# Patient Record
Sex: Female | Born: 1990 | Race: White | Hispanic: No | Marital: Single | Smoking: Former smoker
Health system: Southern US, Community
[De-identification: ages and names within clinical notes are randomized; demographics above are authoritative.]

## PROBLEM LIST (undated history)

## (undated) DIAGNOSIS — J45909 Unspecified asthma, uncomplicated: Secondary | ICD-10-CM

## (undated) DIAGNOSIS — Z5189 Encounter for other specified aftercare: Secondary | ICD-10-CM

## (undated) DIAGNOSIS — I10 Essential (primary) hypertension: Secondary | ICD-10-CM

## (undated) DIAGNOSIS — R011 Cardiac murmur, unspecified: Secondary | ICD-10-CM

## (undated) DIAGNOSIS — F988 Other specified behavioral and emotional disorders with onset usually occurring in childhood and adolescence: Secondary | ICD-10-CM

## (undated) DIAGNOSIS — E669 Obesity, unspecified: Secondary | ICD-10-CM

## (undated) DIAGNOSIS — T7840XA Allergy, unspecified, initial encounter: Secondary | ICD-10-CM

## (undated) HISTORY — DX: Cardiac murmur, unspecified: R01.1

## (undated) HISTORY — DX: Essential (primary) hypertension: I10

## (undated) HISTORY — DX: Obesity, unspecified: E66.9

## (undated) HISTORY — PX: FOOT SURGERY: SHX648

## (undated) HISTORY — DX: Allergy, unspecified, initial encounter: T78.40XA

## (undated) HISTORY — DX: Encounter for other specified aftercare: Z51.89

## (undated) HISTORY — PX: INTRAUTERINE DEVICE INSERTION: SHX323

---
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
## 2008-03-04 ENCOUNTER — Observation Stay: Payer: Self-pay

## 2008-04-18 ENCOUNTER — Observation Stay: Payer: Self-pay

## 2008-04-22 ENCOUNTER — Observation Stay: Payer: Self-pay | Admitting: Obstetrics and Gynecology

## 2008-04-25 ENCOUNTER — Observation Stay: Payer: Self-pay

## 2008-04-30 ENCOUNTER — Observation Stay: Payer: Self-pay

## 2008-05-05 ENCOUNTER — Observation Stay: Payer: Self-pay

## 2008-05-07 ENCOUNTER — Inpatient Hospital Stay: Payer: Self-pay

## 2008-05-11 ENCOUNTER — Emergency Department: Payer: Self-pay | Admitting: Unknown Physician Specialty

## 2009-06-30 ENCOUNTER — Emergency Department: Payer: Self-pay | Admitting: Internal Medicine

## 2011-03-01 ENCOUNTER — Ambulatory Visit: Payer: Self-pay | Admitting: Internal Medicine

## 2011-03-01 LAB — URINALYSIS, COMPLETE
Bilirubin,UR: NEGATIVE
Glucose,UR: NEGATIVE mg/dL (ref 0–75)
Ketone: NEGATIVE
Nitrite: POSITIVE
Ph: 5 (ref 4.5–8.0)
Specific Gravity: >=1.03 (ref 1.003–1.030)
Squamous Epithelial: >30

## 2011-03-01 LAB — PREGNANCY, URINE: Pregnancy Test, Urine: NEGATIVE m[IU]/mL

## 2012-07-28 DIAGNOSIS — E669 Obesity, unspecified: Secondary | ICD-10-CM | POA: Insufficient documentation

## 2012-07-28 DIAGNOSIS — F17200 Nicotine dependence, unspecified, uncomplicated: Secondary | ICD-10-CM | POA: Insufficient documentation

## 2013-04-02 ENCOUNTER — Emergency Department (HOSPITAL_COMMUNITY): Payer: Medicaid Other

## 2013-04-02 ENCOUNTER — Encounter (HOSPITAL_COMMUNITY): Payer: Self-pay | Admitting: Emergency Medicine

## 2013-04-02 ENCOUNTER — Emergency Department (HOSPITAL_COMMUNITY)
Admission: EM | Admit: 2013-04-02 | Discharge: 2013-04-02 | Disposition: A | Discharge status: Home / Self Care | Payer: Medicaid Other | Attending: Emergency Medicine | Admitting: Emergency Medicine

## 2013-04-02 DIAGNOSIS — B349 Viral infection, unspecified: Secondary | ICD-10-CM

## 2013-04-02 DIAGNOSIS — J45901 Unspecified asthma with (acute) exacerbation: Secondary | ICD-10-CM | POA: Insufficient documentation

## 2013-04-02 DIAGNOSIS — B9789 Other viral agents as the cause of diseases classified elsewhere: Secondary | ICD-10-CM | POA: Insufficient documentation

## 2013-04-02 DIAGNOSIS — Z8659 Personal history of other mental and behavioral disorders: Secondary | ICD-10-CM | POA: Insufficient documentation

## 2013-04-02 DIAGNOSIS — F17200 Nicotine dependence, unspecified, uncomplicated: Secondary | ICD-10-CM | POA: Insufficient documentation

## 2013-04-02 HISTORY — DX: Other specified behavioral and emotional disorders with onset usually occurring in childhood and adolescence: F98.8

## 2013-04-02 HISTORY — DX: Unspecified asthma, uncomplicated: J45.909

## 2013-04-02 LAB — RAPID STREP SCREEN (MED CTR MEBANE ONLY): STREPTOCOCCUS, GROUP A SCREEN (DIRECT): NEGATIVE

## 2013-04-02 MED ORDER — IPRATROPIUM BROMIDE 0.02 % IN SOLN
0.5000 mg | Freq: Once | RESPIRATORY_TRACT | Status: AC
  Administered 2013-04-02: 0.5 mg via RESPIRATORY_TRACT
  Filled 2013-04-02: qty 2.5

## 2013-04-02 MED ORDER — ACETAMINOPHEN 325 MG PO TABS
650.0000 mg | ORAL_TABLET | Freq: Once | ORAL | Status: AC
  Administered 2013-04-02: 650 mg via ORAL
  Filled 2013-04-02: qty 2

## 2013-04-02 MED ORDER — SODIUM CHLORIDE 0.9 % IV SOLN
Freq: Once | INTRAVENOUS | Status: AC
Start: 2013-04-02 — End: 2013-04-02
  Administered 2013-04-02: 14:00:00 via INTRAVENOUS

## 2013-04-02 MED ORDER — GUAIFENESIN-CODEINE 100-10 MG/5ML PO SYRP: 10.0000 mL | ORAL_SOLUTION | Freq: Three times a day (TID) | ORAL | Status: DC | PRN

## 2013-04-02 MED ORDER — OSELTAMIVIR PHOSPHATE 75 MG PO CAPS: 75.0000 mg | ORAL_CAPSULE | Freq: Two times a day (BID) | ORAL | Status: DC

## 2013-04-02 MED ORDER — ALBUTEROL SULFATE HFA 108 (90 BASE) MCG/ACT IN AERS
2.0000 | INHALATION_SPRAY | Freq: Once | RESPIRATORY_TRACT | Status: AC
  Administered 2013-04-02: 2 via RESPIRATORY_TRACT
  Filled 2013-04-02: qty 6.7

## 2013-04-02 MED ORDER — PREDNISONE 10 MG PO TABS
60.0000 mg | ORAL_TABLET | Freq: Once | ORAL | Status: AC
  Administered 2013-04-02: 60 mg via ORAL
  Filled 2013-04-02 (×2): qty 1

## 2013-04-02 MED ORDER — IBUPROFEN 800 MG PO TABS: 800.0000 mg | ORAL_TABLET | Freq: Three times a day (TID) | ORAL | Status: DC

## 2013-04-02 MED ORDER — ALBUTEROL SULFATE (2.5 MG/3ML) 0.083% IN NEBU
5.0000 mg | INHALATION_SOLUTION | Freq: Once | RESPIRATORY_TRACT | Status: AC
Start: 2013-04-02 — End: 2013-04-02
  Administered 2013-04-02: 5 mg via RESPIRATORY_TRACT
  Filled 2013-04-02: qty 6

## 2013-04-02 NOTE — ED Notes (Signed)
Pt given two cups of ice and sprite. Pt tolerating well.

## 2013-04-02 NOTE — ED Notes (Signed)
Patient c/o fever, cough, sore throat, and nausea that started yesterday. Patient reports fever as 102.1. Patient reports taking a flu multi symptom over the counter medication at 10:30pm with no relief. Patient denies any vomiting or diarrhea. Patient also reports some wheezing. Patient has hx of asthma. Patient reports using neb treatment this morning at 12am with some relief in wheezing.

## 2013-04-02 NOTE — Discharge Instructions (Signed)
Viral Infections °A virus is a type of germ. Viruses can cause: °· Minor sore throats. °· Aches and pains. °· Headaches. °· Runny nose. °· Rashes. °· Watery eyes. °· Tiredness. °· Coughs. °· Loss of appetite. °· Feeling sick to your stomach (nausea). °· Throwing up (vomiting). °· Watery poop (diarrhea). °HOME CARE  °· Only take medicines as told by your doctor. °· Drink enough water and fluids to keep your pee (urine) clear or pale yellow. Sports drinks are a good choice. °· Get plenty of rest and eat healthy. Soups and broths with crackers or rice are fine. °GET HELP RIGHT AWAY IF:  °· You have a very bad headache. °· You have shortness of breath. °· You have chest pain or neck pain. °· You have an unusual rash. °· You cannot stop throwing up. °· You have watery poop that does not stop. °· You cannot keep fluids down. °· You or your child has a temperature by mouth above 102° F (38.9° C), not controlled by medicine. °· Your baby is older than 3 months with a rectal temperature of 102° F (38.9° C) or higher. °· Your baby is 3 months old or younger with a rectal temperature of 100.4° F (38° C) or higher. °MAKE SURE YOU:  °· Understand these instructions. °· Will watch this condition. °· Will get help right away if you are not doing well or get worse. °Document Released: 01/22/2008 Document Revised: 05/03/2011 Document Reviewed: 06/16/2010 °ExitCare® Patient Information ©2014 [employer]. ° °

## 2013-04-02 NOTE — ED Provider Notes (Signed)
CSN: [id number]     Arrival date & time 04/02/13  0840 History   This chart was scribed for Pauline Aus, PA-C by Ladona Ridgel Day, ED scribe. This patient was seen in room APA15/AP[facility]15 and the patient's care was started at 0840.  Chief Complaint  Patient presents with  . Fever  . Cough   The history is provided by the patient. No language interpreter was used.   HPI Comments: Amanda [name] is a 23 y.o. female who presents to the Emergency Department complaining of constant, gradually worsened fever/chills (max T 102.1 F), myalgias, non-productive, burning cough, chest congestion, wheezing (hx of asthma), onset yesterday afternoon. She reports hx of asthma and last used her maintenance inhaler last PM. She tried taking multi sx flu medicine w/out any relief from fever.  Significant other and her child have had similar sx's recently.  She denies vomiting, diarrhea, dysuria, dyspnea, abdominal or chest pain No allergies to medicines  Past Medical History  Diagnosis Date  . Asthma   . ADD (attention deficit disorder)    History reviewed. No pertinent past surgical history. No family history on file. History  Substance Use Topics  . Smoking status: Current Every Day Smoker -- 0.04 packs/day for 8 years    Types: Cigarettes  . Smokeless tobacco: Never Used  . Alcohol Use: No   OB History   Grav Para Term Preterm Abortions TAB SAB Ect Mult Living   1 1 1       1      Review of Systems  Constitutional: Positive for fever and chills. Negative for fatigue.  HENT: Positive for congestion and sore throat. Negative for trouble swallowing.   Respiratory: Positive for cough and wheezing. Negative for chest tightness and shortness of breath.   Cardiovascular: Negative for chest pain and palpitations.  Gastrointestinal: Positive for nausea. Negative for vomiting, abdominal pain, diarrhea and blood in stool.  Genitourinary: Negative for dysuria, hematuria and flank pain.  Musculoskeletal: Positive  for myalgias. Negative for arthralgias, back pain, neck pain and neck stiffness.  Skin: Negative for rash.  Neurological: Negative for dizziness, weakness and numbness.  Hematological: Does not bruise/bleed easily.   Allergies  Review of patient's allergies indicates no known allergies.  Home Medications   Current Outpatient Rx  Name  Route  Sig  Dispense  Refill  . OVER THE COUNTER MEDICATION   Oral   Take 2 capsules by mouth daily as needed (for cold/flu symptoms). OTC multi symptom cold and flu medication          Triage Vitals: BP 101/54  Pulse 127  Temp(Src) 99.1 F (37.3 C) (Oral)  Resp 20  Ht 5\' 5"  (1.651 m)  Wt 244 lb 1.6 oz (110.723 kg)  BMI 40.62 kg/m2  SpO2 95%  Physical Exam  Constitutional: She is oriented to person, place, and time. She appears well-developed and well-nourished. No distress.  HENT:  Head: Normocephalic and atraumatic.  Right Ear: Tympanic membrane and ear canal normal.  Left Ear: Ear canal normal. Tympanic membrane is not perforated, not retracted and not bulging. No hemotympanum.  Nose: Mucosal edema present. No rhinorrhea.  Mouth/Throat: Uvula is midline and mucous membranes are normal. No trismus in the jaw. No uvula swelling. Posterior oropharyngeal erythema present. No oropharyngeal exudate, posterior oropharyngeal edema or tonsillar abscesses.  Left ear mild effusion behind TM, no bulging, no erythema.   Neck: Normal range of motion and full passive range of motion without pain. Neck supple. No spinous process  tenderness and no muscular tenderness present. Normal range of motion present. No Brudzinski's sign and no Kernig's sign noted.  No meningeal signs    Cardiovascular: Normal rate, regular rhythm, normal heart sounds and intact distal pulses.   No murmur heard. Pulmonary/Chest: Effort normal. She has wheezes (few scattered epxiratory wheezes). She has no rales.  Abdominal: Soft. She exhibits no distension and no mass. There is no  tenderness. There is no rebound and no guarding.  Musculoskeletal: Normal range of motion.  Lymphadenopathy:    She has no cervical adenopathy.  Neurological: She is alert and oriented to person, place, and time. She exhibits normal muscle tone. Coordination normal.  Skin: Skin is warm and dry. No rash noted.   ED Course  Procedures (including critical care time) DIAGNOSTIC STUDIES: Oxygen Saturation is 95% on room air, adequate by my interpretation.    COORDINATION OF CARE: At 1005 AM Discussed treatment plan with patient which includes CXR, A&A neb, tylenol and oral fluids. Patient agrees.   Labs Review Labs Reviewed  RAPID STREP SCREEN  CULTURE, GROUP A STREP   Imaging Review Dg Chest 2 View  04/02/2013   CLINICAL DATA:  Fever, cough, sore throat  EXAM: CHEST  2 VIEW  COMPARISON:  None.  FINDINGS: Cardiomediastinal silhouette is unremarkable. Mild thoracic dextroscoliosis. No acute infiltrate or pleural effusion. No pulmonary edema.  IMPRESSION: No active cardiopulmonary disease.  Mild thoracic dextroscoliosis.   Electronically Signed   By: Natasha MeadLiviu  Pop M.D.   On: 04/02/2013 10:43    EKG Interpretation   None      MDM   Vital signs improved after IVF's, tylenol.  Wheezes resolved after albuterol neb.  Patient is feeling much better and requesting discharge.  Pt requested MDI for home use, inhaler dispensed.  Sick contacts at home with similar sx's, clinical suspicion for PE is low.  Likely influenza.  Will treat with Tamiflu, robitussin AC, and ibuprofen.  Pt agrees to chare plan and appears stable for discharge.  Strict return precautions also given   1. Viral illness     I personally performed the services described in this documentation, which was scribed in my presence. The recorded information has been reviewed and is accurate.        Dezi Brauner L. Trisha Mangleriplett, PA-C 04/03/13 1615

## 2013-04-04 LAB — CULTURE, GROUP A STREP

## 2013-04-04 NOTE — ED Provider Notes (Signed)
Medical screening examination/treatment/procedure(s) were performed by non-physician practitioner and as supervising physician I was immediately available for consultation/collaboration.  EKG Interpretation   None         Mayce Noyes M Anajah Sterbenz, DO 04/04/13 0804 

## 2013-12-24 ENCOUNTER — Encounter (HOSPITAL_COMMUNITY): Payer: Self-pay | Admitting: Emergency Medicine

## 2013-12-31 DIAGNOSIS — I071 Rheumatic tricuspid insufficiency: Secondary | ICD-10-CM | POA: Insufficient documentation

## 2013-12-31 DIAGNOSIS — R5383 Other fatigue: Secondary | ICD-10-CM | POA: Insufficient documentation

## 2016-03-18 DIAGNOSIS — K219 Gastro-esophageal reflux disease without esophagitis: Secondary | ICD-10-CM | POA: Insufficient documentation

## 2016-04-28 DIAGNOSIS — E559 Vitamin D deficiency, unspecified: Secondary | ICD-10-CM | POA: Insufficient documentation

## 2016-05-12 ENCOUNTER — Ambulatory Visit: Payer: Self-pay | Admitting: Podiatry

## 2016-05-28 ENCOUNTER — Ambulatory Visit: Payer: Self-pay | Admitting: Podiatry

## 2016-06-15 ENCOUNTER — Ambulatory Visit: Payer: Self-pay | Admitting: Podiatry

## 2016-06-30 ENCOUNTER — Ambulatory Visit (INDEPENDENT_AMBULATORY_CARE_PROVIDER_SITE_OTHER): Payer: Medicaid Other | Admitting: Podiatry

## 2016-06-30 DIAGNOSIS — L600 Ingrowing nail: Secondary | ICD-10-CM

## 2016-06-30 MED ORDER — NEOMYCIN-POLYMYXIN-HC 1 % OT SOLN: OTIC | 1 refills | Status: DC

## 2016-06-30 NOTE — Progress Notes (Signed)
She presents today with a chief complaint of a painful hallux nail right. She states than bothersome for many years she had trauma to his several years ago and like to just have it removed. She states is becoming more more painful as time goes on she cannot tolerate it any longer.  Objective: Vital signs are stable alert oriented 3 pulses are palpable. Neurological systems intact. Deep tendon reflexes are intact. Muscle strength is 5 over 5 dorsi for spinal flexion inverters everters all his musculature is intact. Orthopedic evaluation was resolved assistant to the ankle full range of motion without crepitation. Cutaneous evaluationof well-hydrated Q is no erythema or edema cellulitis drainage or odor. Sharp radial nail margin along the tibial border of the hallux right with distal onycholysis and nail dystrophy.  Assessment: Ingrown nail for his hallux right.  Plan: Total matrixectomy today hallux right. She tolerated this procedure well after local anesthesia was administered she was provided with both oral and written home going instructions as well as prescription for Cortisporin Otic to be applied twice daily after soaking. I will follow up with her in 1-2 weeks.

## 2016-06-30 NOTE — Patient Instructions (Signed)

## 2016-07-26 ENCOUNTER — Ambulatory Visit: Payer: Medicaid Other | Admitting: Podiatry

## 2016-08-02 ENCOUNTER — Ambulatory Visit (INDEPENDENT_AMBULATORY_CARE_PROVIDER_SITE_OTHER): Payer: Self-pay | Admitting: Podiatry

## 2016-08-02 ENCOUNTER — Encounter: Payer: Self-pay | Admitting: Podiatry

## 2016-08-02 DIAGNOSIS — L600 Ingrowing nail: Secondary | ICD-10-CM

## 2016-08-02 NOTE — Patient Instructions (Signed)

## 2016-08-02 NOTE — Progress Notes (Signed)
She presents today for follow-up of her matrixectomy hallux right. States that it seems to be a little sore but other than that is doing really well.  Objective: No erythema cellulitis drainage or odor appears to be healing quite nicely one small area centrally located of the nailbed that still has yet to heal but otherwise there is no signs of infection and healing appears to be good.  Assessment: Healing surgical toe hallux right.  Plan: Soak once a day or once every other day in Epsom salts and warm water covered in the daily definite bedtime. What for signs and symptoms of infection and notify us if there are any.

## 2016-08-04 ENCOUNTER — Ambulatory Visit: Payer: Medicaid Other | Admitting: Podiatry

## 2017-01-10 ENCOUNTER — Ambulatory Visit: Payer: Self-pay | Admitting: Physician Assistant

## 2017-01-28 ENCOUNTER — Ambulatory Visit: Payer: Self-pay | Admitting: Physician Assistant

## 2017-03-02 ENCOUNTER — Encounter: Payer: Self-pay | Admitting: Physician Assistant

## 2017-03-02 ENCOUNTER — Other Ambulatory Visit: Payer: Self-pay

## 2017-03-02 ENCOUNTER — Ambulatory Visit: Payer: Medicaid Other | Admitting: Physician Assistant

## 2017-03-02 VITALS — BP 110/60 | HR 75 | Temp 98.5°F | Resp 16 | Ht 65.0 in | Wt 303.0 lb

## 2017-03-02 DIAGNOSIS — Z7689 Persons encountering health services in other specified circumstances: Secondary | ICD-10-CM | POA: Diagnosis not present

## 2017-03-02 DIAGNOSIS — K219 Gastro-esophageal reflux disease without esophagitis: Secondary | ICD-10-CM

## 2017-03-02 DIAGNOSIS — I071 Rheumatic tricuspid insufficiency: Secondary | ICD-10-CM | POA: Diagnosis not present

## 2017-03-02 DIAGNOSIS — Z6841 Body Mass Index (BMI) 40.0 and over, adult: Secondary | ICD-10-CM | POA: Diagnosis not present

## 2017-03-02 DIAGNOSIS — J301 Allergic rhinitis due to pollen: Secondary | ICD-10-CM | POA: Diagnosis not present

## 2017-03-02 MED ORDER — FLUTICASONE FUROATE 27.5 MCG/SPRAY NA SUSP
2.0000 | Freq: Every day | NASAL | 12 refills | Status: DC
Start: 2017-03-02 — End: 2017-06-03

## 2017-03-02 MED ORDER — CETIRIZINE HCL 10 MG PO TABS: 10.0000 mg | ORAL_TABLET | Freq: Every day | ORAL | 11 refills | Status: DC

## 2017-03-02 MED ORDER — MONTELUKAST SODIUM 10 MG PO TABS: 10.0000 mg | ORAL_TABLET | Freq: Every day | ORAL | 3 refills | Status: DC

## 2017-03-02 MED ORDER — OMEPRAZOLE 40 MG PO CPDR: DELAYED_RELEASE_CAPSULE | ORAL | 1 refills | Status: DC

## 2017-03-02 NOTE — Progress Notes (Signed)
Name: Amanda [name]   MRN: [id number]    DOB: [birth year]   Date:03/02/2017       Progress Note  Subjective  Chief Complaint  Chief Complaint  Patient presents with  . New Patient (Initial Visit)    HPI Patient is here today as a new patient to establish care.She also wants to talk about weight loss. Patient reports that she has taken Phentermine before. Last time she remembers was back in May 2018. She reports that she already got a gym membership and started to go today. She also reports that she started to do the Keto diet but it was hard to follow so she switch to Weight Watchers.   Past Medical History:  Diagnosis Date  . ADD (attention deficit disorder)   . Allergy   . Asthma   . Blood transfusion without reported diagnosis   . Heart murmur     History reviewed. No pertinent surgical history.  Family History  Problem Relation Age of Onset  . Cancer Paternal Aunt        Breast Cancer    Social History   Socioeconomic History  . Marital status: Single    Spouse name: Not on file  . Number of children: Not on file  . Years of education: Not on file  . Highest education level: Not on file  Social Needs  . Financial resource strain: Not on file  . Food insecurity - worry: Not on file  . Food insecurity - inability: Not on file  . Transportation needs - medical: Not on file  . Transportation needs - non-medical: Not on file  Occupational History  . Not on file  Tobacco Use  . Smoking status: Former Smoker    Packs/day: 0.04    Years: 8.00    Pack years: 0.32    Types: Cigarettes  . Smokeless tobacco: Former NeurosurgeonUser    Quit date: 08/06/2016  Substance and Sexual Activity  . Alcohol use: No  . Drug use: No  . Sexual activity: Yes    Birth control/protection: Implant  Other Topics Concern  . Not on file  Social History Narrative  . Not on file     Current Outpatient Medications:  .  omeprazole (PRILOSEC) 40 MG capsule, take 1 capsule by mouth every morning 1  HOUR PRIOR TO BREAKFAST, Disp: , Rfl: 0 .  phentermine 37.5 MG capsule, Take 37.5 mg by mouth., Disp: , Rfl:  .  NEOMYCIN-POLYMYXIN-HYDROCORTISONE (CORTISPORIN) 1 % SOLN otic solution, Apply 1-2 drops to toe BID after soaking (Patient not taking: Reported on 03/02/2017), Disp: 10 mL, Rfl: 1  No Known Allergies   Review of Systems  Constitutional: Negative.   HENT: Negative.        Sneezing  Eyes: Negative.   Respiratory: Positive for shortness of breath.   Cardiovascular: Negative.   Gastrointestinal: Negative.   Genitourinary: Negative.   Musculoskeletal: Negative.   Skin: Negative.   Neurological: Negative.   Endo/Heme/Allergies: Negative.   Psychiatric/Behavioral: Negative.    Objective  Vitals:   03/02/17 1436  BP: 110/60  Pulse: 75  Resp: 16  Temp: 98.5 F (36.9 C)  TempSrc: Oral  Weight: (!) 303 lb (137.4 kg)  Height: 5\' 5"  (1.651 m)    Physical Exam  Constitutional: She is oriented to person, place, and time and well-developed, well-nourished, and in no distress. No distress.  HENT:  Head: Normocephalic and atraumatic.  Right Ear: Hearing, tympanic membrane, external ear and ear canal normal.  Left Ear: Hearing, tympanic membrane, external ear and ear canal normal.  Nose: Nose normal.  Mouth/Throat: Oropharynx is clear and moist. No oropharyngeal exudate.  Eyes: Conjunctivae and EOM are normal. Pupils are equal, round, and reactive to light. Right eye exhibits no discharge. Left eye exhibits no discharge.  Neck: Normal range of motion. Neck supple. No tracheal deviation present. No thyromegaly present.  Cardiovascular: Normal rate, regular rhythm, normal heart sounds and intact distal pulses.  No murmur heard. Pulmonary/Chest: Effort normal and breath sounds normal. No respiratory distress.  Abdominal: Soft. Bowel sounds are normal. She exhibits no distension. There is no tenderness.  Musculoskeletal: Normal range of motion. She exhibits no edema.   Lymphadenopathy:    She has no cervical adenopathy.  Neurological: She is alert and oriented to person, place, and time.  Skin: Skin is warm and dry. She is not diaphoretic.  Psychiatric: Mood, memory, affect and judgment normal.  Vitals reviewed.   Assessment & Plan  1. Establishing care with new doctor, encounter for  2. Non-seasonal allergic rhinitis due to pollen Will refill for patient to have zyrtec, singulair and flonase on hand for allergic rhinitis.  - cetirizine (ZYRTEC) 10 MG tablet; Take 1 tablet (10 mg total) by mouth daily.  Dispense: 30 tablet; Refill: 11 - montelukast (SINGULAIR) 10 MG tablet; Take 1 tablet (10 mg total) by mouth at bedtime.  Dispense: 30 tablet; Refill: 3 - fluticasone (FLONASE SENSIMIST) 27.5 MCG/SPRAY nasal spray; Place 2 sprays into the nose daily.  Dispense: 10 g; Refill: 12  3. Gastroesophageal reflux disease, esophagitis presence not specified Patient already on medication and stable.  - omeprazole (PRILOSEC) 40 MG capsule; take 1 capsule by mouth every morning 1 HOUR PRIOR TO BREAKFAST  Dispense: 90 capsule; Refill: 1  4. Tricuspid valve insufficiency, unspecified etiology Patient is required to have echo every 2 years for tricuspid regurgitation and left ventricular "stiffening" per patient. Previously done with [facility]. Will obtain medical release form today and request records from [facility] to see when patient will be due for next echo as well as her CPE.  5. Morbidly obese (HCC) Long discussion over weight loss. Advised patient to try a food diary and limit calories to 1200-1400 calories. Lose It may be a good platform for her as it now allows you to take a picture of your food to input caloric intake. She agrees to try. May also continue weight watchers if this is easier. Start slowly adding exercise to make routine. I will see her back in 4 weeks to see how she is doing on her own and will then consider adding phentermine. Patient in  agreement.   6. BMI 50.0-59.9, adult [facility]) See above medical treatment plan.  I spent approximately 45 minutes with the patient today. Over 50% of this time was spent with counseling and educating the patient about weight loss.

## 2017-03-02 NOTE — Patient Instructions (Signed)

## 2017-03-30 ENCOUNTER — Encounter: Payer: Self-pay | Admitting: Physician Assistant

## 2017-03-30 ENCOUNTER — Ambulatory Visit (INDEPENDENT_AMBULATORY_CARE_PROVIDER_SITE_OTHER): Payer: Medicaid Other | Admitting: Physician Assistant

## 2017-03-30 VITALS — BP 110/60 | HR 72 | Temp 97.8°F | Resp 16 | Ht 65.0 in | Wt 304.0 lb

## 2017-03-30 DIAGNOSIS — Z713 Dietary counseling and surveillance: Secondary | ICD-10-CM | POA: Diagnosis not present

## 2017-03-30 DIAGNOSIS — Z6841 Body Mass Index (BMI) 40.0 and over, adult: Secondary | ICD-10-CM | POA: Diagnosis not present

## 2017-03-30 MED ORDER — PHENTERMINE HCL 37.5 MG PO TABS: 37.5000 mg | ORAL_TABLET | Freq: Every day | ORAL | 0 refills | Status: DC

## 2017-03-30 NOTE — Patient Instructions (Signed)

## 2017-03-30 NOTE — Progress Notes (Signed)
Patient: Amanda [name] Female    DOB: 1990-06-17   27 y.o.   MRN: [id number] Visit Date: 03/30/2017  Today's Provider: Margaretann Loveless, PA-C   Chief Complaint  Patient presents with  . Follow-up   Subjective:    HPI  Follow up for weight  The patient was last seen for this 4 weeks ago. Changes made at last visit include patient advised to start diet and exercise.  Patient reports going to the gym 3 days a week 20 minutes. Patient reports doing cardio and some toning. Patient reports some diet changes not eating late at night, and picking healthier snacks.  Wt Readings from Last 3 Encounters:  03/30/17 (!) 304 lb (137.9 kg)  03/02/17 (!) 303 lb (137.4 kg)  04/02/13 244 lb 1.6 oz (110.7 kg)   ------------------------------------------------------------------------------------    No Known Allergies   Current Outpatient Medications:  .  cetirizine (ZYRTEC) 10 MG tablet, Take 1 tablet (10 mg total) by mouth daily., Disp: 30 tablet, Rfl: 11 .  fluticasone (FLONASE SENSIMIST) 27.5 MCG/SPRAY nasal spray, Place 2 sprays into the nose daily., Disp: 10 g, Rfl: 12 .  montelukast (SINGULAIR) 10 MG tablet, Take 1 tablet (10 mg total) by mouth at bedtime., Disp: 30 tablet, Rfl: 3 .  omeprazole (PRILOSEC) 40 MG capsule, take 1 capsule by mouth every morning 1 HOUR PRIOR TO BREAKFAST, Disp: 90 capsule, Rfl: 1  Review of Systems  Constitutional: Negative.   HENT: Negative.   Eyes: Negative.   Respiratory: Negative.   Cardiovascular: Negative.   Musculoskeletal: Negative.     Social History   Tobacco Use  . Smoking status: Former Smoker    Packs/day: 0.04    Years: 8.00    Pack years: 0.32    Types: Cigarettes  . Smokeless tobacco: Former Neurosurgeon    Quit date: 08/06/2016  Substance Use Topics  . Alcohol use: No   Objective:   BP 110/60 (BP Location: Left Arm, Patient Position: Sitting, Cuff Size: Large)   Pulse 72   Temp 97.8 F (36.6 C) (Oral)   Resp 16   Ht 5'  5" (1.651 m)   Wt (!) 304 lb (137.9 kg)   SpO2 98%   BMI 50.59 kg/m  Vitals:   03/30/17 1515  BP: 110/60  Pulse: 72  Resp: 16  Temp: 97.8 F (36.6 C)  TempSrc: Oral  SpO2: 98%  Weight: (!) 304 lb (137.9 kg)  Height: 5\' 5"  (1.651 m)     Physical Exam  Constitutional: She appears well-developed and well-nourished. No distress.  Neck: Normal range of motion. Neck supple. No JVD present. No tracheal deviation present. No thyromegaly present.  Cardiovascular: Normal rate, regular rhythm and normal heart sounds. Exam reveals no gallop and no friction rub.  No murmur heard. Pulmonary/Chest: Effort normal and breath sounds normal. No respiratory distress. She has no wheezes. She has no rales.  Lymphadenopathy:    She has no cervical adenopathy.  Skin: She is not diaphoretic.  Vitals reviewed.      Assessment & Plan:     1. Encounter for weight loss counseling Will start phentermine as below since patient has been making lifestyle changes on her own. I will see her back in 4 weeks to recheck weight. - phentermine (ADIPEX-P) 37.5 MG tablet; Take 1 tablet (37.5 mg total) by mouth daily before breakfast.  Dispense: 30 tablet; Refill: 0  2. Morbidly obese (HCC) See above medical treatment plan. - phentermine (  ADIPEX-P) 37.5 MG tablet; Take 1 tablet (37.5 mg total) by mouth daily before breakfast.  Dispense: 30 tablet; Refill: 0  3. BMI 50.0-59.9, adult [facility](HCC) See above medical treatment plan. - phentermine (ADIPEX-P) 37.5 MG tablet; Take 1 tablet (37.5 mg total) by mouth daily before breakfast.  Dispense: 30 tablet; Refill: 0       Margaretann LovelessJennifer M Taja Pentland, PA-C  [facility]

## 2017-04-28 ENCOUNTER — Encounter: Payer: Self-pay | Admitting: Physician Assistant

## 2017-04-28 ENCOUNTER — Ambulatory Visit (INDEPENDENT_AMBULATORY_CARE_PROVIDER_SITE_OTHER): Payer: Medicaid Other | Admitting: Physician Assistant

## 2017-04-28 VITALS — BP 108/60 | HR 111 | Temp 97.6°F | Resp 16 | Wt 292.0 lb

## 2017-04-28 DIAGNOSIS — Z713 Dietary counseling and surveillance: Secondary | ICD-10-CM

## 2017-04-28 DIAGNOSIS — Z6841 Body Mass Index (BMI) 40.0 and over, adult: Secondary | ICD-10-CM | POA: Diagnosis not present

## 2017-04-28 MED ORDER — PHENTERMINE HCL 37.5 MG PO TABS: 37.5000 mg | ORAL_TABLET | Freq: Every day | ORAL | 2 refills | Status: DC

## 2017-04-28 NOTE — Progress Notes (Signed)
Patient: Amanda [name] Female    DOB: 13-Jul-1990   26 y.o.   MRN: [id number] Visit Date: 04/28/2017  Today's Provider: Margaretann Loveless, PA-C   Chief Complaint  Patient presents with  . Follow-up    Obesity   Subjective:    HPI  Obesity, Follow up:  The patient was last seen for weight loss counseling 1 weeks ago. Changes made since that visit include start Phentermine.  She reports excellent compliance with treatment. She is not having side effects. Some Dry mouth. Previous weight was 304 pounds, today she is 292 pounds. She reports decreased portion size without food diary and exercising 3-4 days per week. Missed this past week due to flu.  ------------------------------------------------------------------------    No Known Allergies   Current Outpatient Medications:  .  cetirizine (ZYRTEC) 10 MG tablet, Take 1 tablet (10 mg total) by mouth daily., Disp: 30 tablet, Rfl: 11 .  fluticasone (FLONASE SENSIMIST) 27.5 MCG/SPRAY nasal spray, Place 2 sprays into the nose daily., Disp: 10 g, Rfl: 12 .  omeprazole (PRILOSEC) 40 MG capsule, take 1 capsule by mouth every morning 1 HOUR PRIOR TO BREAKFAST, Disp: 90 capsule, Rfl: 1 .  phentermine (ADIPEX-P) 37.5 MG tablet, Take 1 tablet (37.5 mg total) by mouth daily before breakfast., Disp: 30 tablet, Rfl: 0 .  montelukast (SINGULAIR) 10 MG tablet, Take 1 tablet (10 mg total) by mouth at bedtime. (Patient not taking: Reported on 04/28/2017), Disp: 30 tablet, Rfl: 3  Review of Systems  Constitutional: Negative.   Respiratory: Negative.   Cardiovascular: Negative.   Neurological: Negative.   Psychiatric/Behavioral: Negative.     Social History   Tobacco Use  . Smoking status: Former Smoker    Packs/day: 0.04    Years: 8.00    Pack years: 0.32    Types: Cigarettes  . Smokeless tobacco: Former Neurosurgeon    Quit date: 08/06/2016  Substance Use Topics  . Alcohol use: No   Objective:   BP 108/60 (BP Location: Left Wrist,  Patient Position: Sitting, Cuff Size: Normal)   Pulse (!) 111   Temp 97.6 F (36.4 C) (Oral)   Resp 16   Wt 292 lb (132.5 kg)   BMI 48.59 kg/m    Physical Exam  Constitutional: She appears well-developed and well-nourished. No distress.  Neck: Normal range of motion. Neck supple. No tracheal deviation present. No thyromegaly present.  Cardiovascular: Normal rate, regular rhythm and normal heart sounds. Exam reveals no gallop and no friction rub.  No murmur heard. Pulmonary/Chest: Effort normal and breath sounds normal. No respiratory distress. She has no wheezes. She has no rales.  Lymphadenopathy:    She has no cervical adenopathy.  Skin: She is not diaphoretic.  Psychiatric: She has a normal mood and affect. Her behavior is normal. Judgment and thought content normal.  Vitals reviewed.      Assessment & Plan:     1. Encounter for weight loss counseling Patient has lost 12 pounds per our record since starting phentermine. Tolerating well. Will continue phentermine as below. Discussed importance of focusing on lifestyle modifications to help her keep weight off in long term.  I will see her back in 3 months for her CPE and weight recheck.  - phentermine (ADIPEX-P) 37.5 MG tablet; Take 1 tablet (37.5 mg total) by mouth daily before breakfast.  Dispense: 30 tablet; Refill: 2  2. Morbidly obese (HCC) See above medical treatment plan. - phentermine (ADIPEX-P) 37.5 MG tablet; Take  1 tablet (37.5 mg total) by mouth daily before breakfast.  Dispense: 30 tablet; Refill: 2  3. BMI 45.0-49.9, adult [facility](HCC) See above medical treatment plan. - phentermine (ADIPEX-P) 37.5 MG tablet; Take 1 tablet (37.5 mg total) by mouth daily before breakfast.  Dispense: 30 tablet; Refill: 2       Margaretann LovelessJennifer M Donyel Castagnola, PA-C  [facility]

## 2017-05-06 ENCOUNTER — Ambulatory Visit: Payer: Self-pay | Admitting: Family Medicine

## 2017-06-03 ENCOUNTER — Encounter: Payer: Self-pay | Admitting: Physician Assistant

## 2017-06-03 ENCOUNTER — Ambulatory Visit (INDEPENDENT_AMBULATORY_CARE_PROVIDER_SITE_OTHER): Payer: Medicaid Other | Admitting: Physician Assistant

## 2017-06-03 VITALS — BP 110/76 | HR 86 | Temp 97.5°F | Resp 16 | Ht 65.0 in | Wt 292.4 lb

## 2017-06-03 DIAGNOSIS — Z6841 Body Mass Index (BMI) 40.0 and over, adult: Secondary | ICD-10-CM

## 2017-06-03 DIAGNOSIS — Z0000 Encounter for general adult medical examination without abnormal findings: Secondary | ICD-10-CM | POA: Diagnosis not present

## 2017-06-03 DIAGNOSIS — Z124 Encounter for screening for malignant neoplasm of cervix: Secondary | ICD-10-CM | POA: Diagnosis not present

## 2017-06-03 DIAGNOSIS — Z23 Encounter for immunization: Secondary | ICD-10-CM

## 2017-06-03 DIAGNOSIS — Z131 Encounter for screening for diabetes mellitus: Secondary | ICD-10-CM

## 2017-06-03 DIAGNOSIS — E559 Vitamin D deficiency, unspecified: Secondary | ICD-10-CM

## 2017-06-03 DIAGNOSIS — L918 Other hypertrophic disorders of the skin: Secondary | ICD-10-CM | POA: Diagnosis not present

## 2017-06-03 DIAGNOSIS — Z13220 Encounter for screening for lipoid disorders: Secondary | ICD-10-CM

## 2017-06-03 DIAGNOSIS — Z136 Encounter for screening for cardiovascular disorders: Secondary | ICD-10-CM

## 2017-06-03 NOTE — Progress Notes (Signed)
Patient: Amanda [name], Female    DOB: 16-Dec-1990, 27 y.o.   MRN: [id number] Visit Date: 06/03/2017  Today's Provider: Margaretann Loveless, PA-C   Chief Complaint  Patient presents with  . Annual Exam   Subjective:    Annual physical exam Amanda [name] is a 27 y.o. female who presents today for health maintenance and complete physical. She feels well. She reports exercising none. She reports she is sleeping well. -----------------------------------------------------------------  Patient C/O skin tags and moles. Patient requesting to have them removed.   Review of Systems  Constitutional: Negative.   HENT: Negative.   Eyes: Negative.   Respiratory: Negative.   Cardiovascular: Negative.   Gastrointestinal: Negative.   Endocrine: Negative.   Genitourinary: Negative.   Musculoskeletal: Negative.   Skin: Negative.   Allergic/Immunologic: Negative.   Neurological: Negative.   Hematological: Negative.   Psychiatric/Behavioral: Negative.     Social History      She  reports that she has quit smoking. Her smoking use included cigarettes. She has a 0.32 pack-year smoking history. She quit smokeless tobacco use about 9 months ago. She reports that she does not drink alcohol or use drugs.       Social History   Socioeconomic History  . Marital status: Single    Spouse name: Not on file  . Number of children: Not on file  . Years of education: Not on file  . Highest education level: Not on file  Occupational History  . Not on file  Social Needs  . Financial resource strain: Not on file  . Food insecurity:    Worry: Not on file    Inability: Not on file  . Transportation needs:    Medical: Not on file    Non-medical: Not on file  Tobacco Use  . Smoking status: Former Smoker    Packs/day: 0.04    Years: 8.00    Pack years: 0.32    Types: Cigarettes  . Smokeless tobacco: Former Neurosurgeon    Quit date: 08/06/2016  Substance and Sexual Activity  . Alcohol use: No  .  Drug use: No  . Sexual activity: Yes    Birth control/protection: Implant  Lifestyle  . Physical activity:    Days per week: Not on file    Minutes per session: Not on file  . Stress: Not on file  Relationships  . Social connections:    Talks on phone: Not on file    Gets together: Not on file    Attends religious service: Not on file    Active member of club or organization: Not on file    Attends meetings of clubs or organizations: Not on file    Relationship status: Not on file  Other Topics Concern  . Not on file  Social History Narrative  . Not on file    Past Medical History:  Diagnosis Date  . ADD (attention deficit disorder)   . Allergy   . Asthma   . Blood transfusion without reported diagnosis   . Heart murmur      Patient Active Problem List   Diagnosis Date Noted  . Vitamin D deficiency 04/28/2016  . Laryngopharyngeal reflux 03/18/2016  . 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 Fatigue 12/31/2013  . Obesity 07/28/2012  . Tobacco use disorder 07/28/2012    No past surgical history on file.  Family History        Family Status  Relation Name Status  . Dennie Bible  Aunt  (Not Specified)  . Mother  Alive  . Father  Alive  . Brother  Alive  . Brother  Alive        Her family history includes Asthma in her mother; Cancer in her paternal aunt.      No Known Allergies   Current Outpatient Medications:  .  cetirizine (ZYRTEC) 10 MG tablet, Take 1 tablet (10 mg total) by mouth daily., Disp: 30 tablet, Rfl: 11 .  omeprazole (PRILOSEC) 40 MG capsule, take 1 capsule by mouth every morning 1 HOUR PRIOR TO BREAKFAST, Disp: 90 capsule, Rfl: 1 .  phentermine (ADIPEX-P) 37.5 MG tablet, Take 1 tablet (37.5 mg total) by mouth daily before breakfast., Disp: 30 tablet, Rfl: 2   Patient Care Team: Margaretann Loveless, PA-C as PCP - General (Family Medicine)      Objective:   Vitals: BP 110/76 (BP Location: Left Arm, Patient Position: Sitting, Cuff Size: Large)    Pulse 86   Temp (!) 97.5 F (36.4 C) (Oral)   Resp 16   Ht 5\' 5"  (1.651 m)   Wt 292 lb 6.4 oz (132.6 kg)   SpO2 97%   BMI 48.66 kg/m    Vitals:   06/03/17 1031  BP: 110/76  Pulse: 86  Resp: 16  Temp: (!) 97.5 F (36.4 C)  TempSrc: Oral  SpO2: 97%  Weight: 292 lb 6.4 oz (132.6 kg)  Height: 5\' 5"  (1.651 m)     Physical Exam  Constitutional: She is oriented to person, place, and time. She appears well-developed and well-nourished. No distress.  HENT:  Head: Normocephalic and atraumatic.  Right Ear: Hearing, tympanic membrane, external ear and ear canal normal.  Left Ear: Hearing, tympanic membrane, external ear and ear canal normal.  Nose: Nose normal.  Mouth/Throat: Uvula is midline, oropharynx is clear and moist and mucous membranes are normal. No oropharyngeal exudate.  Eyes: Pupils are equal, round, and reactive to light. Conjunctivae and EOM are normal. Right eye exhibits no discharge. Left eye exhibits no discharge. No scleral icterus.  Neck: Normal range of motion. Neck supple. No JVD present. Carotid bruit is not present. No tracheal deviation present. No thyromegaly present.    Cardiovascular: Normal rate, regular rhythm, normal heart sounds and intact distal pulses. Exam reveals no gallop and no friction rub.  No murmur heard. Pulmonary/Chest: Effort normal and breath sounds normal. No respiratory distress. She has no wheezes. She has no rales. She exhibits no tenderness. Right breast exhibits no inverted nipple, no mass, no nipple discharge, no skin change and no tenderness. Left breast exhibits no inverted nipple, no mass, no nipple discharge, no skin change and no tenderness. No breast tenderness, discharge or bleeding. Breasts are symmetrical.  Abdominal: Soft. Bowel sounds are normal. She exhibits no distension and no mass. There is no tenderness. There is no rebound and no guarding. Hernia confirmed negative in the right inguinal area and confirmed negative in the  left inguinal area.  Genitourinary: Rectum normal, vagina normal and uterus normal.    No breast tenderness, discharge or bleeding. Pelvic exam was performed with patient supine. There is no rash, tenderness, lesion or injury on the right labia. There is no rash, tenderness, lesion or injury on the left labia. Cervix exhibits no motion tenderness, no discharge and no friability. Right adnexum displays no mass, no tenderness and no fullness. Left adnexum displays no mass, no tenderness and no fullness. No erythema, tenderness or bleeding in the vagina. No signs of  injury around the vagina. No vaginal discharge found.    Musculoskeletal: Normal range of motion. She exhibits no edema or tenderness.  Lymphadenopathy:    She has no cervical adenopathy.       Right: No inguinal adenopathy present.       Left: No inguinal adenopathy present.  Neurological: She is alert and oriented to person, place, and time. She has normal reflexes. No cranial nerve deficit. Coordination normal.  Skin: Skin is warm and dry. No rash noted. She is not diaphoretic.  Psychiatric: She has a normal mood and affect. Her behavior is normal. Judgment and thought content normal.  Vitals reviewed.    Depression Screen PHQ 2/9 Scores 06/03/2017 03/02/2017  PHQ - 2 Score 0 0  PHQ- 9 Score 0 -      Assessment & Plan:     Routine Health Maintenance and Physical Exam  Exercise Activities and Dietary recommendations Goals    None       There is no immunization history on file for this patient.  Health Maintenance  Topic Date Due  . HIV Screening  08/17/2003  . TETANUS/TDAP  08/17/2007  . PAP SMEAR  08/16/2009  . INFLUENZA VACCINE  09/22/2017     Discussed health benefits of physical activity, and encouraged her to engage in regular exercise appropriate for her age and condition.    1. Annual physical exam Normal physical exam today. Will check labs as below and f/u pending lab results. If labs are stable  and WNL she will not need to have these rechecked for one year at her next annual physical exam. She is to call the office in the meantime if she has any acute issue, questions or concerns. - CBC w/Diff/Platelet - Comprehensive Metabolic Panel (CMET) - TSH - Lipid Profile  2. Cervical cancer screening Pap collected today. Will send as below and f/u pending results. - Pap IG w/ reflex to HPV when ASC-U  3. Need for hepatitis A vaccination Patient advised to call Health Department for updating vaccinations since she is on Medicaid.   4. Vitamin D deficiency H/O this. On high dose supplementation.Will check labs as below and f/u pending results. - CBC w/Diff/Platelet - Vitamin D (25 hydroxy)  5. Class 3 severe obesity due to excess calories without serious comorbidity with body mass index (BMI) of 45.0 to 49.9 in adult [facility](HCC) Counseled patient on healthy lifestyle modifications including dieting and exercise.  Patient is exercising and doing well with weight loss thus far. Has lost 11 pounds since February. - Comprehensive Metabolic Panel (CMET) - Lipid Profile - HgB A1c  6. Diabetes mellitus screening Will check labs as below and f/u pending results. - HgB A1c  7. Encounter for lipid screening for cardiovascular disease Will check labs as below and f/u pending results. - Lipid Profile  8. Skin tag Cryotherapy performed on all 6 skin tags without issue. - Cryotherapy/destruct benign or premalignant lesion  --------------------------------------------------------------------    Margaretann LovelessJennifer M Zurich Carreno, PA-C  [facility]

## 2017-06-03 NOTE — Patient Instructions (Addendum)
Please update your vaccines at the health Department. You are due for Td and second dose of Hep A vaccine.   Health Maintenance, Female Adopting a healthy lifestyle and getting preventive care can go a long way to promote health and wellness. Talk with your health care provider about what schedule of regular examinations is right for you. This is a good chance for you to check in with your provider about disease prevention and staying healthy. In between checkups, there are plenty of things you can do on your own. Experts have done a lot of research about which lifestyle changes and preventive measures are most likely to keep you healthy. Ask your health care provider for more information. Weight and diet Eat a healthy diet  Be sure to include plenty of vegetables, fruits, low-fat dairy products, and lean protein.  Do not eat a lot of foods high in solid fats, added sugars, or salt.  Get regular exercise. This is one of the most important things you can do for your health. ? Most adults should exercise for at least 150 minutes each week. The exercise should increase your heart rate and make you sweat (moderate-intensity exercise). ? Most adults should also do strengthening exercises at least twice a week. This is in addition to the moderate-intensity exercise.  Maintain a healthy weight  Body mass index (BMI) is a measurement that can be used to identify possible weight problems. It estimates body fat based on height and weight. Your health care provider can help determine your BMI and help you achieve or maintain a healthy weight.  For females 41 years of age and older: ? A BMI below 18.5 is considered underweight. ? A BMI of 18.5 to 24.9 is normal. ? A BMI of 25 to 29.9 is considered overweight. ? A BMI of 30 and above is considered obese.  Watch levels of cholesterol and blood lipids  You should start having your blood tested for lipids and cholesterol at 29 years of age, then have  this test every 5 years.  You may need to have your cholesterol levels checked more often if: ? Your lipid or cholesterol levels are high. ? You are older than 29 years of age. ? You are at high risk for heart disease.  Cancer screening Lung Cancer  Lung cancer screening is recommended for adults 83-42 years old who are at high risk for lung cancer because of a history of smoking.  A yearly low-dose CT scan of the lungs is recommended for people who: ? Currently smoke. ? Have quit within the past 15 years. ? Have at least a 30-pack-year history of smoking. A pack year is smoking an average of one pack of cigarettes a day for 1 year.  Yearly screening should continue until it has been 15 years since you quit.  Yearly screening should stop if you develop a health problem that would prevent you from having lung cancer treatment.  Breast Cancer  Practice breast self-awareness. This means understanding how your breasts normally appear and feel.  It also means doing regular breast self-exams. Let your health care provider know about any changes, no matter how small.  If you are in your 20s or 30s, you should have a clinical breast exam (CBE) by a health care provider every 1-3 years as part of a regular health exam.  If you are 38 or older, have a CBE every year. Also consider having a breast X-ray (mammogram) every year.  If you have  a family history of breast cancer, talk to your health care provider about genetic screening.  If you are at high risk for breast cancer, talk to your health care provider about having an MRI and a mammogram every year.  Breast cancer gene (BRCA) assessment is recommended for women who have family members with BRCA-related cancers. BRCA-related cancers include: ? Breast. ? Ovarian. ? Tubal. ? Peritoneal cancers.  Results of the assessment will determine the need for genetic counseling and BRCA1 and BRCA2 testing.  Cervical Cancer Your health care  provider may recommend that you be screened regularly for cancer of the pelvic organs (ovaries, uterus, and vagina). This screening involves a pelvic examination, including checking for microscopic changes to the surface of your cervix (Pap test). You may be encouraged to have this screening done every 3 years, beginning at age 52.  For women ages 4-65, health care providers may recommend pelvic exams and Pap testing every 3 years, or they may recommend the Pap and pelvic exam, combined with testing for human papilloma virus (HPV), every 5 years. Some types of HPV increase your risk of cervical cancer. Testing for HPV may also be done on women of any age with unclear Pap test results.  Other health care providers may not recommend any screening for nonpregnant women who are considered low risk for pelvic cancer and who do not have symptoms. Ask your health care provider if a screening pelvic exam is right for you.  If you have had past treatment for cervical cancer or a condition that could lead to cancer, you need Pap tests and screening for cancer for at least 20 years after your treatment. If Pap tests have been discontinued, your risk factors (such as having a new sexual partner) need to be reassessed to determine if screening should resume. Some women have medical problems that increase the chance of getting cervical cancer. In these cases, your health care provider may recommend more frequent screening and Pap tests.  Colorectal Cancer  This type of cancer can be detected and often prevented.  Routine colorectal cancer screening usually begins at 29 years of age and continues through 29 years of age.  Your health care provider may recommend screening at an earlier age if you have risk factors for colon cancer.  Your health care provider may also recommend using home test kits to check for hidden blood in the stool.  A small camera at the end of a tube can be used to examine your colon  directly (sigmoidoscopy or colonoscopy). This is done to check for the earliest forms of colorectal cancer.  Routine screening usually begins at age 32.  Direct examination of the colon should be repeated every 5-10 years through 29 years of age. However, you may need to be screened more often if early forms of precancerous polyps or small growths are found.  Skin Cancer  Check your skin from head to toe regularly.  Tell your health care provider about any new moles or changes in moles, especially if there is a change in a mole's shape or color.  Also tell your health care provider if you have a mole that is larger than the size of a pencil eraser.  Always use sunscreen. Apply sunscreen liberally and repeatedly throughout the day.  Protect yourself by wearing long sleeves, pants, a wide-brimmed hat, and sunglasses whenever you are outside.  Heart disease, diabetes, and high blood pressure  High blood pressure causes heart disease and increases the risk  of stroke. High blood pressure is more likely to develop in: ? People who have blood pressure in the high end of the normal range (130-139/85-89 mm Hg). ? People who are overweight or obese. ? People who are African American.  If you are 16-41 years of age, have your blood pressure checked every 3-5 years. If you are 72 years of age or older, have your blood pressure checked every year. You should have your blood pressure measured twice-once when you are at a hospital or clinic, and once when you are not at a hospital or clinic. Record the average of the two measurements. To check your blood pressure when you are not at a hospital or clinic, you can use: ? An automated blood pressure machine at a pharmacy. ? A home blood pressure monitor.  If you are between 28 years and 84 years old, ask your health care provider if you should take aspirin to prevent strokes.  Have regular diabetes screenings. This involves taking a blood sample to  check your fasting blood sugar level. ? If you are at a normal weight and have a low risk for diabetes, have this test once every three years after 29 years of age. ? If you are overweight and have a high risk for diabetes, consider being tested at a younger age or more often. Preventing infection Hepatitis B  If you have a higher risk for hepatitis B, you should be screened for this virus. You are considered at high risk for hepatitis B if: ? You were born in a country where hepatitis B is common. Ask your health care provider which countries are considered high risk. ? Your parents were born in a high-risk country, and you have not been immunized against hepatitis B (hepatitis B vaccine). ? You have HIV or AIDS. ? You use needles to inject street drugs. ? You live with someone who has hepatitis B. ? You have had sex with someone who has hepatitis B. ? You get hemodialysis treatment. ? You take certain medicines for conditions, including cancer, organ transplantation, and autoimmune conditions.  Hepatitis C  Blood testing is recommended for: ? Everyone born from [birth year] through 1967. ? Anyone with known risk factors for hepatitis C.  Sexually transmitted infections (STIs)  You should be screened for sexually transmitted infections (STIs) including gonorrhea and chlamydia if: ? You are sexually active and are younger than 29 years of age. ? You are older than 29 years of age and your health care provider tells you that you are at risk for this type of infection. ? Your sexual activity has changed since you were last screened and you are at an increased risk for chlamydia or gonorrhea. Ask your health care provider if you are at risk.  If you do not have HIV, but are at risk, it may be recommended that you take a prescription medicine daily to prevent HIV infection. This is called pre-exposure prophylaxis (PrEP). You are considered at risk if: ? You are sexually active and do not regularly  use condoms or know the HIV status of your partner(s). ? You take drugs by injection. ? You are sexually active with a partner who has HIV.  Talk with your health care provider about whether you are at high risk of being infected with HIV. If you choose to begin PrEP, you should first be tested for HIV. You should then be tested every 3 months for as long as you are taking PrEP. Pregnancy  If you are premenopausal and you may become pregnant, ask your health care provider about preconception counseling.  If you may become pregnant, take 400 to 800 micrograms (mcg) of folic acid every day.  If you want to prevent pregnancy, talk to your health care provider about birth control (contraception). Osteoporosis and menopause  Osteoporosis is a disease in which the bones lose minerals and strength with aging. This can result in serious bone fractures. Your risk for osteoporosis can be identified using a bone density scan.  If you are 36 years of age or older, or if you are at risk for osteoporosis and fractures, ask your health care provider if you should be screened.  Ask your health care provider whether you should take a calcium or vitamin D supplement to lower your risk for osteoporosis.  Menopause may have certain physical symptoms and risks.  Hormone replacement therapy may reduce some of these symptoms and risks. Talk to your health care provider about whether hormone replacement therapy is right for you. Follow these instructions at home:  Schedule regular health, dental, and eye exams.  Stay current with your immunizations.  Do not use any tobacco products including cigarettes, chewing tobacco, or electronic cigarettes.  If you are pregnant, do not drink alcohol.  If you are breastfeeding, limit how much and how often you drink alcohol.  Limit alcohol intake to no more than 1 drink per day for nonpregnant women. One drink equals 12 ounces of beer, 5 ounces of wine, or 1 ounces  of hard liquor.  Do not use street drugs.  Do not share needles.  Ask your health care provider for help if you need support or information about quitting drugs.  Tell your health care provider if you often feel depressed.  Tell your health care provider if you have ever been abused or do not feel safe at home. This information is not intended to replace advice given to you by your health care provider. Make sure you discuss any questions you have with your health care provider. Document Released: 08/24/2010 Document Revised: 07/17/2015 Document Reviewed: 11/12/2014 Elsevier Interactive Patient Education  [employer].

## 2017-06-04 LAB — COMPREHENSIVE METABOLIC PANEL
ALT: 18 IU/L (ref 0–32)
AST: 13 IU/L (ref 0–40)
Albumin/Globulin Ratio: 1.3 (ref 1.2–2.2)
Albumin: 3.8 g/dL (ref 3.5–5.5)
Alkaline Phosphatase: 55 IU/L (ref 39–117)
BILIRUBIN TOTAL: 0.7 mg/dL (ref 0.0–1.2)
BUN/Creatinine Ratio: 6 — ABNORMAL LOW (ref 9–23)
BUN: 6 mg/dL (ref 6–20)
CHLORIDE: 103 mmol/L (ref 96–106)
CO2: 22 mmol/L (ref 20–29)
Calcium: 9 mg/dL (ref 8.7–10.2)
Creatinine, Ser: 0.95 mg/dL (ref 0.57–1.00)
GFR calc Af Amer: 94 mL/min/{1.73_m2} (ref >59)
GFR calc non Af Amer: 82 mL/min/{1.73_m2} (ref >59)
Globulin, Total: 3 g/dL (ref 1.5–4.5)
Glucose: 94 mg/dL (ref 65–99)
POTASSIUM: 4 mmol/L (ref 3.5–5.2)
Sodium: 138 mmol/L (ref 134–144)
Total Protein: 6.8 g/dL (ref 6.0–8.5)

## 2017-06-04 LAB — CBC WITH DIFFERENTIAL/PLATELET
Basophils Absolute: 0.1 10*3/uL (ref 0.0–0.2)
Basos: 1 %
EOS (ABSOLUTE): 0.3 10*3/uL (ref 0.0–0.4)
Eos: 4 %
HEMATOCRIT: 37.8 % (ref 34.0–46.6)
Hemoglobin: 12.5 g/dL (ref 11.1–15.9)
Immature Grans (Abs): 0 10*3/uL (ref 0.0–0.1)
Immature Granulocytes: 0 %
LYMPHS ABS: 2.1 10*3/uL (ref 0.7–3.1)
Lymphs: 31 %
MCH: 29.2 pg (ref 26.6–33.0)
MCHC: 33.1 g/dL (ref 31.5–35.7)
MCV: 88 fL (ref 79–97)
MONOS ABS: 0.6 10*3/uL (ref 0.1–0.9)
Monocytes: 9 %
Neutrophils Absolute: 3.8 10*3/uL (ref 1.4–7.0)
Neutrophils: 55 %
Platelets: 311 10*3/uL (ref 150–379)
RBC: 4.28 x10E6/uL (ref 3.77–5.28)
RDW: 13.9 % (ref 12.3–15.4)
WBC: 6.8 10*3/uL (ref 3.4–10.8)

## 2017-06-04 LAB — TSH: TSH: 1.8 u[IU]/mL (ref 0.450–4.500)

## 2017-06-04 LAB — LIPID PANEL
Chol/HDL Ratio: 2.8 ratio (ref 0.0–4.4)
Cholesterol, Total: 140 mg/dL (ref 100–199)
HDL: 50 mg/dL (ref >39)
LDL Calculated: 77 mg/dL (ref 0–99)
Triglycerides: 65 mg/dL (ref 0–149)
VLDL Cholesterol Cal: 13 mg/dL (ref 5–40)

## 2017-06-04 LAB — HEMOGLOBIN A1C
ESTIMATED AVERAGE GLUCOSE: 108 mg/dL
HEMOGLOBIN A1C: 5.4 % (ref 4.8–5.6)

## 2017-06-04 LAB — VITAMIN D 25 HYDROXY (VIT D DEFICIENCY, FRACTURES): VIT D 25 HYDROXY: 16 ng/mL — AB (ref 30.0–100.0)

## 2017-06-06 ENCOUNTER — Telehealth: Payer: Self-pay

## 2017-06-06 DIAGNOSIS — E559 Vitamin D deficiency, unspecified: Secondary | ICD-10-CM

## 2017-06-06 LAB — PAP IG W/ RFLX HPV ASCU: PAP Smear Comment: 0

## 2017-06-06 MED ORDER — ERGOCALCIFEROL 1.25 MG (50000 UT) PO CAPS: 50000.0000 [IU] | ORAL_CAPSULE | ORAL | 3 refills | Status: DC

## 2017-06-06 NOTE — Telephone Encounter (Signed)
[facility]

## 2017-06-06 NOTE — Telephone Encounter (Signed)
Tried calling patient and no answer. Will try again later.  

## 2017-06-06 NOTE — Telephone Encounter (Signed)
Patient returning your call and requesting a call back. °

## 2017-06-06 NOTE — Telephone Encounter (Signed)
-----   Message from Margaretann LovelessJennifer M Burnette, [place]PA-C sent at 06/06/2017  1:47 PM EDT ----- All labs are normal except for Vit D is still low. Make sure to be taking Vit D supplement and if needed refill please let us know and we will send in.

## 2017-06-06 NOTE — Telephone Encounter (Signed)
Patient advised as directed below.Reports that she does need refill for the Vitamin D.  Thanks,  -Cassadi Purdie

## 2017-06-07 ENCOUNTER — Telehealth: Payer: Self-pay

## 2017-06-07 NOTE — Telephone Encounter (Signed)
-----   Message from Margaretann LovelessJennifer M Burnette, [place]-C sent at 06/07/2017  8:21 AM EDT ----- Pap is normal.  Will repeat in 3 years.

## 2017-06-07 NOTE — Telephone Encounter (Signed)
Viewed by Evangeline GulaAshley D Wan on 06/07/2017 8:46 AM

## 2017-06-07 NOTE — Telephone Encounter (Signed)
Notes recorded by Margaretann LovelessBurnette, Jennifer M, PA-C on 06/06/2017 at 1:47 PM EDT All labs are normal except for Vit D is still low. Make sure to be taking Vit D supplement and if needed refill please let us know and we will send in.

## 2017-07-04 ENCOUNTER — Encounter: Payer: Self-pay | Admitting: Physician Assistant

## 2017-08-03 ENCOUNTER — Encounter: Payer: Self-pay | Admitting: Physician Assistant

## 2017-09-06 ENCOUNTER — Encounter: Payer: Self-pay | Admitting: Physician Assistant

## 2017-09-06 ENCOUNTER — Ambulatory Visit (INDEPENDENT_AMBULATORY_CARE_PROVIDER_SITE_OTHER): Payer: Medicaid Other | Admitting: Physician Assistant

## 2017-09-06 VITALS — BP 118/84 | HR 100 | Temp 97.9°F | Resp 16 | Wt 294.0 lb

## 2017-09-06 DIAGNOSIS — L918 Other hypertrophic disorders of the skin: Secondary | ICD-10-CM

## 2017-09-06 DIAGNOSIS — Z716 Tobacco abuse counseling: Secondary | ICD-10-CM

## 2017-09-06 DIAGNOSIS — E559 Vitamin D deficiency, unspecified: Secondary | ICD-10-CM | POA: Diagnosis not present

## 2017-09-06 DIAGNOSIS — L989 Disorder of the skin and subcutaneous tissue, unspecified: Secondary | ICD-10-CM | POA: Diagnosis not present

## 2017-09-06 DIAGNOSIS — Z6841 Body Mass Index (BMI) 40.0 and over, adult: Secondary | ICD-10-CM | POA: Diagnosis not present

## 2017-09-06 MED ORDER — LORCASERIN HCL 10 MG PO TABS: 10.0000 mg | ORAL_TABLET | Freq: Two times a day (BID) | ORAL | 0 refills | Status: DC

## 2017-09-06 MED ORDER — VARENICLINE TARTRATE 0.5 MG X 11 & 1 MG X 42 PO MISC: ORAL | 0 refills | Status: DC

## 2017-09-06 NOTE — Progress Notes (Signed)
Patient: Amanda [name] Female    DOB: 1990/10/15   27 y.o.   MRN: [id number] Visit Date: 09/06/2017  Today's Provider: Margaretann Loveless, PA-C   I, Joslyn Hy, CMA, am acting as scribe for [employer], PA-C.  Chief Complaint  Patient presents with  . Obesity  . Skin Problem   Subjective:    HPI     Follow up for Obesity  The patient was last seen for this 4 months ago. Changes made at last visit include continuing phentermine.  She reports fair compliance with treatment. She states she D/C the medication for 1.5 months in between refills, and then restarted this. Today is the last day of her current rx. She feels that condition is Improved. She is not having side effects.   Wt Readings from Last 3 Encounters:  09/06/17 294 lb (133.4 kg)  06/03/17 292 lb 6.4 oz (132.6 kg)  04/28/17 292 lb (132.5 kg)   ------------------------------------------------------------------------------------ Vitamin D Deficiency Pt is currently taking 50,000 IU of vitamin D once weekly. She is wondering if this lab needs to be rechecked, as she is due for refills soon.  Skin Problem Pt was also seen for a skin tag recently. Cryotherapy was performed on 6 skin tags on 06/03/2017. Pt states only one of the skin tags was successfully removed. She would also like a referral to dermatology to discuss more facial skin issues.  Tobacco Abuse Pt has started having cravings for cigarettes again. She has not started smoking again, but is requesting a prescription of Chantix to stop these cravings.    No Known Allergies   Current Outpatient Medications:  .  cetirizine (ZYRTEC) 10 MG tablet, Take 1 tablet (10 mg total) by mouth daily., Disp: 30 tablet, Rfl: 11 .  ergocalciferol (VITAMIN D2) 50000 units capsule, Take 1 capsule (50,000 Units total) by mouth once a week., Disp: 4 capsule, Rfl: 3 .  levonorgestrel (MIRENA) 20 MCG/24HR IUD, 1 each by Intrauterine route once., Disp: , Rfl:   .  omeprazole (PRILOSEC) 40 MG capsule, take 1 capsule by mouth every morning 1 HOUR PRIOR TO BREAKFAST, Disp: 90 capsule, Rfl: 1 .  phentermine (ADIPEX-P) 37.5 MG tablet, Take 1 tablet (37.5 mg total) by mouth daily before breakfast., Disp: 30 tablet, Rfl: 2  Review of Systems  Constitutional: Positive for activity change (decreased exercise while vacationing). Negative for appetite change, chills, diaphoresis, fatigue, fever and unexpected weight change.  Cardiovascular: Negative for chest pain, palpitations and leg swelling.    Social History   Tobacco Use  . Smoking status: Former Smoker    Packs/day: 0.04    Years: 8.00    Pack years: 0.32    Types: Cigarettes  . Smokeless tobacco: Former Neurosurgeon    Quit date: 08/06/2016  Substance Use Topics  . Alcohol use: No   Objective:   BP 118/84 (BP Location: Left Arm, Patient Position: Sitting, Cuff Size: Large)   Pulse 100   Temp 97.9 F (36.6 C) (Oral)   Resp 16   Wt 294 lb (133.4 kg)   SpO2 99%   BMI 48.92 kg/m  Vitals:   09/06/17 1403  BP: 118/84  Pulse: 100  Resp: 16  Temp: 97.9 F (36.6 C)  TempSrc: Oral  SpO2: 99%  Weight: 294 lb (133.4 kg)     Physical Exam  Constitutional: She appears well-developed and well-nourished. No distress.  HENT:  Head: Normocephalic and atraumatic.  Neck: Normal range of motion.  Neck supple. No JVD present. No tracheal deviation present. No thyromegaly present.  Cardiovascular: Normal rate, regular rhythm and normal heart sounds. Exam reveals no gallop and no friction rub.  No murmur heard. Pulmonary/Chest: Effort normal and breath sounds normal. No respiratory distress. She has no wheezes. She has no rales.  Lymphadenopathy:    She has no cervical adenopathy.  Skin: She is not diaphoretic.  Skin tags, what appears to be ance scars on face  Vitals reviewed.       Assessment & Plan:     1. Encounter for smoking cessation counseling Will start chantix as below to help with  smoking cessation. Patient has quit smoking but is starting to have cravings. Using chantix to defer cravings.  - varenicline (CHANTIX PAK) 0.5 MG X 11 & 1 MG X 42 tablet; Take one 0.5 mg tab PO once daily for 3 days, then increase to one 0.5 mg tab BID for 4 days, then increase to one 1 mg tab BID.  Dispense: 53 tablet; Refill: 0  2. Vitamin D deficiency H/O this and recently put on high dose supplementation. Checking lab to see if high dose still required or if she can be transitioned to OTC treatment.  - Vitamin D (25 hydroxy)  3. Skin abnormality Patient requesting dermatology referral for skin check and skin tag removal.  - Ambulatory referral to Dermatology  4. Skin tag See above medical treatment plan. - Ambulatory referral to Dermatology  5. Class 3 severe obesity due to excess calories without serious comorbidity with body mass index (BMI) of 45.0 to 49.9 in adult [facility](HCC) Will try to see if Belviq can be approved since patient failed phentermine. If not approved, will have to take a 3 month holiday from phentermine and will consider restart in Oct 2019. Discussed calorie counting with a food diary. Also discussed keeping an exercise schedule.  - Lorcaserin HCl 10 MG TABS; Take 10 mg by mouth 2 (two) times daily.  Dispense: 60 tablet; Refill: 0

## 2017-09-07 ENCOUNTER — Telehealth: Payer: Self-pay

## 2017-09-07 LAB — VITAMIN D 25 HYDROXY (VIT D DEFICIENCY, FRACTURES): VIT D 25 HYDROXY: 32.4 ng/mL (ref 30.0–100.0)

## 2017-09-07 NOTE — Telephone Encounter (Signed)
Tried calling; no answer.   Thanks,   -Ayanah Snader  

## 2017-09-07 NOTE — Telephone Encounter (Signed)
-----   Message from Margaretann LovelessJennifer M Burnette, PA-C sent at 09/07/2017  9:23 AM EDT ----- Vit D is normalizing. Continue what you have Rx wise then can transition to Vit D 1000 IU OTC.

## 2017-09-08 NOTE — Telephone Encounter (Signed)
Viewed by Evangeline GulaAshley D Nier on 09/07/2017 10:57 AM

## 2017-09-09 ENCOUNTER — Ambulatory Visit: Payer: Medicaid Other | Admitting: Physician Assistant

## 2017-09-19 ENCOUNTER — Telehealth: Payer: Self-pay | Admitting: Physician Assistant

## 2017-09-19 NOTE — Telephone Encounter (Signed)
Pt was in about a week ago and was prescribed a medication for weight loss.  It needed to have a PA and she has not heard anything back as of today.  Please advise    CB# [phone]  Thanks Barth Kirksteri

## 2017-09-21 ENCOUNTER — Encounter: Payer: Self-pay | Admitting: Physician Assistant

## 2017-09-21 NOTE — Telephone Encounter (Signed)
Medicaid PAtakes up to 4 weeks to know the status. Most of the Weight Loss medication are not covered by medicaid and they ask for additional information that was faxed today.  Thanks,  -Claudell Rhody

## 2017-09-21 NOTE — Telephone Encounter (Signed)
Spoke with patient this morning. Please see other phone note.

## 2017-09-26 ENCOUNTER — Encounter: Payer: Self-pay | Admitting: Physician Assistant

## 2017-10-16 ENCOUNTER — Encounter: Payer: Self-pay | Admitting: Emergency Medicine

## 2017-10-16 ENCOUNTER — Emergency Department
Admission: EM | Admit: 2017-10-16 | Discharge: 2017-10-17 | Disposition: A | Discharge status: Home / Self Care | Payer: Medicaid Other | Attending: Emergency Medicine | Admitting: Emergency Medicine

## 2017-10-16 DIAGNOSIS — N3000 Acute cystitis without hematuria: Secondary | ICD-10-CM

## 2017-10-16 DIAGNOSIS — J45909 Unspecified asthma, uncomplicated: Secondary | ICD-10-CM | POA: Insufficient documentation

## 2017-10-16 DIAGNOSIS — Y9389 Activity, other specified: Secondary | ICD-10-CM | POA: Diagnosis not present

## 2017-10-16 DIAGNOSIS — F909 Attention-deficit hyperactivity disorder, unspecified type: Secondary | ICD-10-CM | POA: Diagnosis not present

## 2017-10-16 DIAGNOSIS — R079 Chest pain, unspecified: Secondary | ICD-10-CM | POA: Diagnosis not present

## 2017-10-16 DIAGNOSIS — Z79899 Other long term (current) drug therapy: Secondary | ICD-10-CM | POA: Insufficient documentation

## 2017-10-16 DIAGNOSIS — M7918 Myalgia, other site: Secondary | ICD-10-CM | POA: Diagnosis not present

## 2017-10-16 DIAGNOSIS — Z87891 Personal history of nicotine dependence: Secondary | ICD-10-CM | POA: Diagnosis not present

## 2017-10-16 DIAGNOSIS — M542 Cervicalgia: Secondary | ICD-10-CM | POA: Diagnosis present

## 2017-10-16 DIAGNOSIS — Y92411 Interstate highway as the place of occurrence of the external cause: Secondary | ICD-10-CM | POA: Insufficient documentation

## 2017-10-16 DIAGNOSIS — Y998 Other external cause status: Secondary | ICD-10-CM | POA: Diagnosis not present

## 2017-10-16 DIAGNOSIS — R109 Unspecified abdominal pain: Secondary | ICD-10-CM | POA: Insufficient documentation
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
## 2017-10-17 ENCOUNTER — Emergency Department: Payer: Medicaid Other

## 2017-10-17 ENCOUNTER — Encounter: Payer: Self-pay | Admitting: Radiology

## 2017-10-17 LAB — COMPREHENSIVE METABOLIC PANEL
ALBUMIN: 3.6 g/dL (ref 3.5–5.0)
ALK PHOS: 46 U/L (ref 38–126)
ALT: 18 U/L (ref 0–44)
AST: 15 U/L (ref 15–41)
Anion gap: 3 — ABNORMAL LOW (ref 5–15)
BUN: 14 mg/dL (ref 6–20)
CO2: 26 mmol/L (ref 22–32)
Calcium: 8.2 mg/dL — ABNORMAL LOW (ref 8.9–10.3)
Chloride: 106 mmol/L (ref 98–111)
Creatinine, Ser: 0.93 mg/dL (ref 0.44–1.00)
GFR calc Af Amer: >60 mL/min (ref >60)
GFR calc non Af Amer: >60 mL/min (ref >60)
GLUCOSE: 103 mg/dL — AB (ref 70–99)
POTASSIUM: 3.5 mmol/L (ref 3.5–5.1)
Sodium: 135 mmol/L (ref 135–145)
TOTAL PROTEIN: 7 g/dL (ref 6.5–8.1)
Total Bilirubin: 0.8 mg/dL (ref 0.3–1.2)

## 2017-10-17 LAB — URINALYSIS, COMPLETE (UACMP) WITH MICROSCOPIC
Bilirubin Urine: NEGATIVE
Glucose, UA: NEGATIVE mg/dL
Hgb urine dipstick: NEGATIVE
Ketones, ur: NEGATIVE mg/dL
Nitrite: NEGATIVE
Protein, ur: 30 mg/dL — AB
RBC / HPF: >50 RBC/hpf — ABNORMAL HIGH (ref 0–5)
SPECIFIC GRAVITY, URINE: 1.023 (ref 1.005–1.030)
pH: 6 (ref 5.0–8.0)

## 2017-10-17 LAB — CBC
HEMATOCRIT: 33.7 % — AB (ref 35.0–47.0)
HEMOGLOBIN: 12.2 g/dL (ref 12.0–16.0)
MCH: 31.8 pg (ref 26.0–34.0)
MCHC: 36.1 g/dL — ABNORMAL HIGH (ref 32.0–36.0)
MCV: 87.9 fL (ref 80.0–100.0)
Platelets: 287 10*3/uL (ref 150–440)
RBC: 3.83 MIL/uL (ref 3.80–5.20)
RDW: 13.7 % (ref 11.5–14.5)
WBC: 7.8 10*3/uL (ref 3.6–11.0)

## 2017-10-17 LAB — POCT PREGNANCY, URINE: PREG TEST UR: NEGATIVE

## 2017-10-17 MED ORDER — CYCLOBENZAPRINE HCL 10 MG PO TABS: 10.0000 mg | ORAL_TABLET | Freq: Three times a day (TID) | ORAL | 0 refills | Status: DC | PRN

## 2017-10-17 MED ORDER — IOPAMIDOL (ISOVUE-300) INJECTION 61%
100.0000 mL | Freq: Once | INTRAVENOUS | Status: AC | PRN
  Administered 2017-10-17: 100 mL via INTRAVENOUS

## 2017-10-17 MED ORDER — ONDANSETRON HCL 4 MG/2ML IJ SOLN
4.0000 mg | Freq: Once | INTRAMUSCULAR | Status: AC
  Administered 2017-10-17: 4 mg via INTRAVENOUS
  Filled 2017-10-17: qty 2

## 2017-10-17 MED ORDER — CEPHALEXIN 500 MG PO CAPS: 500.0000 mg | ORAL_CAPSULE | Freq: Two times a day (BID) | ORAL | 0 refills | Status: AC

## 2017-10-17 MED ORDER — PENTAFLUOROPROP-TETRAFLUOROETH EX AERO
INHALATION_SPRAY | CUTANEOUS | Status: AC
  Filled 2017-10-17: qty 30

## 2017-10-17 MED ORDER — CEPHALEXIN 500 MG PO CAPS: 500.0000 mg | ORAL_CAPSULE | Freq: Two times a day (BID) | ORAL | 0 refills | Status: DC

## 2017-10-17 MED ORDER — MORPHINE SULFATE (PF) 2 MG/ML IV SOLN
2.0000 mg | Freq: Once | INTRAVENOUS | Status: AC
  Administered 2017-10-17: 2 mg via INTRAVENOUS
  Filled 2017-10-17: qty 1
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
## 2017-10-28 ENCOUNTER — Encounter: Payer: Self-pay | Admitting: Physician Assistant

## 2017-11-03 ENCOUNTER — Encounter: Payer: Self-pay | Admitting: Physician Assistant

## 2017-11-08 ENCOUNTER — Encounter: Payer: Self-pay | Admitting: Physician Assistant

## 2017-11-08 DIAGNOSIS — K219 Gastro-esophageal reflux disease without esophagitis: Secondary | ICD-10-CM

## 2017-11-08 MED ORDER — OMEPRAZOLE 40 MG PO CPDR: DELAYED_RELEASE_CAPSULE | ORAL | 1 refills | Status: DC

## 2017-11-22 ENCOUNTER — Encounter: Payer: Self-pay | Admitting: Physician Assistant

## 2017-12-02 ENCOUNTER — Encounter: Payer: Self-pay | Admitting: Physician Assistant

## 2017-12-02 ENCOUNTER — Ambulatory Visit (INDEPENDENT_AMBULATORY_CARE_PROVIDER_SITE_OTHER): Payer: Medicaid Other | Admitting: Physician Assistant

## 2017-12-02 VITALS — BP 120/70 | HR 78 | Temp 98.2°F | Resp 16 | Wt 304.2 lb

## 2017-12-02 DIAGNOSIS — Z23 Encounter for immunization: Secondary | ICD-10-CM | POA: Diagnosis not present

## 2017-12-02 DIAGNOSIS — R0981 Nasal congestion: Secondary | ICD-10-CM | POA: Diagnosis not present

## 2017-12-02 DIAGNOSIS — R400 Somnolence: Secondary | ICD-10-CM | POA: Diagnosis not present

## 2017-12-02 DIAGNOSIS — J4520 Mild intermittent asthma, uncomplicated: Secondary | ICD-10-CM | POA: Diagnosis not present

## 2017-12-02 DIAGNOSIS — Z6841 Body Mass Index (BMI) 40.0 and over, adult: Secondary | ICD-10-CM

## 2017-12-02 DIAGNOSIS — L989 Disorder of the skin and subcutaneous tissue, unspecified: Secondary | ICD-10-CM

## 2017-12-02 DIAGNOSIS — R0683 Snoring: Secondary | ICD-10-CM

## 2017-12-02 MED ORDER — PHENTERMINE HCL 37.5 MG PO TABS: 37.5000 mg | ORAL_TABLET | Freq: Every day | ORAL | 2 refills | Status: DC

## 2017-12-02 MED ORDER — ALBUTEROL SULFATE HFA 108 (90 BASE) MCG/ACT IN AERS: 2.0000 | INHALATION_SPRAY | Freq: Four times a day (QID) | RESPIRATORY_TRACT | 0 refills | Status: DC | PRN

## 2017-12-02 NOTE — Progress Notes (Signed)
Patient: Amanda [name] Female    DOB: 23-May-1990   27 y.o.   MRN: [id number] Visit Date: 12/02/2017  Today's Provider: Margaretann Loveless, PA-C   Chief Complaint  Patient presents with  . Follow-up    Weight Loss Counseling   Subjective:    HPI  Weight Loss Counseling, Follow up:  The patient was last seen for Weight Loss Counseling 3 months ago. Changes made since that visit include start Belviq but it was not approved. Patient reports that she went back on Phentermine. She has been off phentermine for 3 months now.   She reports excellent compliance with treatment. She is not having side effects. .  Patient reports exercising 2-4 times a week on the elliptical and Cardio. ------------------------------------------------------------------------ Asthma:Patient also requesting a prescription for the Inhaler. Reports that she had Albuterol but can't find it and feels like she needs it around this time or when it gets colder. She is having the following symptoms sob, chest tightness.  Sleep problem: Patient reports that she gets around 5 hours of sleep, but lately she feels tired and falling asleep while driving. She does reports that she does not want to do a sleep study. She wants to know what it can be causing it and is there something to keep her more alert during the day.     No Known Allergies   Current Outpatient Medications:  .  cetirizine (ZYRTEC) 10 MG tablet, Take 1 tablet (10 mg total) by mouth daily., Disp: 30 tablet, Rfl: 11 .  cyclobenzaprine (FLEXERIL) 10 MG tablet, Take 1 tablet (10 mg total) by mouth 3 (three) times daily as needed., Disp: 30 tablet, Rfl: 0 .  levonorgestrel (MIRENA) 20 MCG/24HR IUD, 1 each by Intrauterine route once., Disp: , Rfl:  .  omeprazole (PRILOSEC) 40 MG capsule, take 1 capsule by mouth every morning 1 HOUR PRIOR TO BREAKFAST, Disp: 90 capsule, Rfl: 1 .  ergocalciferol (VITAMIN D2) 50000 units capsule, Take 1 capsule (50,000  Units total) by mouth once a week. (Patient not taking: Reported on 12/02/2017), Disp: 4 capsule, Rfl: 3 .  varenicline (CHANTIX PAK) 0.5 MG X 11 & 1 MG X 42 tablet, Take one 0.5 mg tab PO once daily for 3 days, then increase to one 0.5 mg tab BID for 4 days, then increase to one 1 mg tab BID. (Patient not taking: Reported on 12/02/2017), Disp: 53 tablet, Rfl: 0  Review of Systems  Constitutional: Positive for fatigue.  HENT: Positive for congestion and sinus pressure.   Eyes: Negative.   Respiratory: Positive for chest tightness and shortness of breath.   Cardiovascular: Negative for chest pain, palpitations and leg swelling.  Gastrointestinal: Negative.   Endocrine: Negative.   Genitourinary: Negative.   Musculoskeletal: Negative.   Skin: Negative.   Allergic/Immunologic: Negative.   Neurological: Negative.   Hematological: Negative.   Psychiatric/Behavioral: Positive for sleep disturbance.    Social History   Tobacco Use  . Smoking status: Former Smoker    Packs/day: 0.04    Years: 8.00    Pack years: 0.32    Types: Cigarettes    Last attempt to quit: 08/06/2016    Years since quitting: 1.3  . Smokeless tobacco: Never Used  Substance Use Topics  . Alcohol use: No   Objective:   BP 120/70 (BP Location: Left Wrist, Patient Position: Sitting, Cuff Size: Normal)   Pulse 78   Temp 98.2 F (36.8 C) (Oral)   Resp  16   Wt (!) 304 lb 3.2 oz (138 kg)   SpO2 96%   BMI 49.10 kg/m  Vitals:   12/02/17 1404  BP: 120/70  Pulse: 78  Resp: 16  Temp: 98.2 F (36.8 C)  TempSrc: Oral  SpO2: 96%  Weight: (!) 304 lb 3.2 oz (138 kg)     Physical Exam  Constitutional: She appears well-developed and well-nourished. No distress.  Neck: Normal range of motion. Neck supple.  Cardiovascular: Normal rate, regular rhythm and normal heart sounds. Exam reveals no gallop and no friction rub.  No murmur heard. Pulmonary/Chest: Effort normal and breath sounds normal. No respiratory  distress. She has no wheezes. She has no rales.  Skin: She is not diaphoretic.  Psychiatric: She has a normal mood and affect. Her behavior is normal. Judgment and thought content normal.  Vitals reviewed.       Assessment & Plan:     1. Class 3 severe obesity due to excess calories without serious comorbidity with body mass index (BMI) of 45.0 to 49.9 in adult (HCC) Gained 10 pounds in 3 months off phentermine. Discussed lifestyle modifications. Will restart phentermine as below. I will see her back in 3 months.  - phentermine (ADIPEX-P) 37.5 MG tablet; Take 1 tablet (37.5 mg total) by mouth daily before breakfast.  Dispense: 30 tablet; Refill: 2  2. Mild intermittent asthma without complication Stable. Diagnosis pulled for medication refill. Continue current medical treatment plan. - albuterol (PROVENTIL HFA;VENTOLIN HFA) 108 (90 Base) MCG/ACT inhaler; Inhale 2 puffs into the lungs every 6 (six) hours as needed for wheezing or shortness of breath.  Dispense: 1 Inhaler; Refill: 0  3. Chronic nasal congestion Patient reports loud snoring, daytime somnolence and chronic nasal congestion (always having to breathe through her mouth since she was a young kid). She also has chronic sinus pressure despite allergy medications. She is requesting a referral to ENT to see if she may have some nasal obstruction.  - Ambulatory referral to ENT  4. Daytime somnolence Discussed sleep apnea. Patient declines sleep study as she feels "cannot sleep with that thing covering my face." Discussed weight loss, avoid sleeping supine, and using nasal breathe right strips to see if they help her sleep. Referral to ENT made to evaluate for upper airway issues.   5. Snoring See above medical treatment plan.  6. Skin abnormality Patient request referral to dermatology for acne and scars on face. Had previous referral but patient unable to keep appt due to issues with her [employer] card. Reports these have  been fixed and would like a new referral.  - Ambulatory referral to Dermatology  7. Need for influenza vaccination Flu vaccine given today without complication. Patient sat upright for 15 minutes to check for adverse reaction before being released. - Flu Vaccine QUAD 36+ mos IM       Margaretann Loveless, PA-C  [facility]

## 2017-12-05 ENCOUNTER — Telehealth: Payer: Self-pay | Admitting: Physician Assistant

## 2017-12-05 DIAGNOSIS — Z6841 Body Mass Index (BMI) 40.0 and over, adult: Principal | ICD-10-CM

## 2017-12-05 MED ORDER — PHENTERMINE HCL 37.5 MG PO TABS: 37.5000 mg | ORAL_TABLET | Freq: Every day | ORAL | 2 refills | Status: DC

## 2017-12-05 NOTE — Telephone Encounter (Signed)
Please call to cancel phentermine at [facility], phentermine has been sent to [facility]

## 2017-12-05 NOTE — Telephone Encounter (Signed)
Please Review

## 2017-12-05 NOTE — Telephone Encounter (Signed)
Pt has a rx of phentermine at [facility] in [place] but she wants to know if you can transfer the prescription to [facility] on [address]  CB#  [phone]  Thanks teri \

## 2017-12-07 NOTE — Telephone Encounter (Signed)
LM to canceled prescription at [facility].

## 2017-12-13 ENCOUNTER — Encounter: Payer: Self-pay | Admitting: Physician Assistant

## 2017-12-14 ENCOUNTER — Telehealth: Payer: Self-pay

## 2017-12-14 NOTE — Telephone Encounter (Signed)
Spoke to mother tida and advised that Amanda [name] has appt today 12/14/17 for flu vaccine.  Mother rescheduled for 12/21/17 at 2:40pm.  dbs,cma

## 2018-01-02 ENCOUNTER — Ambulatory Visit: Payer: Medicaid Other | Admitting: Physician Assistant

## 2018-01-04 ENCOUNTER — Telehealth: Payer: Self-pay | Admitting: Physician Assistant

## 2018-01-04 ENCOUNTER — Ambulatory Visit: Payer: Medicaid Other | Admitting: Physician Assistant

## 2018-01-04 ENCOUNTER — Encounter: Payer: Self-pay | Admitting: Physician Assistant

## 2018-01-04 DIAGNOSIS — Z6841 Body Mass Index (BMI) 40.0 and over, adult: Principal | ICD-10-CM

## 2018-01-04 NOTE — Telephone Encounter (Signed)
Please see patient's mychart messages.  

## 2018-01-04 NOTE — Progress Notes (Deleted)
       Patient: Amanda [name] Female    DOB: 21-Aug-1990   27 y.o.   MRN: [id number] Visit Date: 01/04/2018  Today's Provider: Margaretann LovelessJennifer M Burnette, PA-C   No chief complaint on file.  Subjective:    HPI  Weight Loss Counseling, Follow up:  The patient was last seen for this 4 weeks ago. Changes made since that visit include restart Phentermine.  She reports {excellent/good/fair/poor:19665} compliance with treatment. She {ACTION; IS/IS [id number]} having side effects. ***. ------------------------------------------------------------------------      No Known Allergies   Current Outpatient Medications:  .  albuterol (PROVENTIL HFA;VENTOLIN HFA) 108 (90 Base) MCG/ACT inhaler, Inhale 2 puffs into the lungs every 6 (six) hours as needed for wheezing or shortness of breath., Disp: 1 Inhaler, Rfl: 0 .  cetirizine (ZYRTEC) 10 MG tablet, Take 1 tablet (10 mg total) by mouth daily., Disp: 30 tablet, Rfl: 11 .  cyclobenzaprine (FLEXERIL) 10 MG tablet, Take 1 tablet (10 mg total) by mouth 3 (three) times daily as needed., Disp: 30 tablet, Rfl: 0 .  ergocalciferol (VITAMIN D2) 50000 units capsule, Take 1 capsule (50,000 Units total) by mouth once a week. (Patient not taking: Reported on 12/02/2017), Disp: 4 capsule, Rfl: 3 .  levonorgestrel (MIRENA) 20 MCG/24HR IUD, 1 each by Intrauterine route once., Disp: , Rfl:  .  omeprazole (PRILOSEC) 40 MG capsule, take 1 capsule by mouth every morning 1 HOUR PRIOR TO BREAKFAST, Disp: 90 capsule, Rfl: 1 .  phentermine (ADIPEX-P) 37.5 MG tablet, Take 1 tablet (37.5 mg total) by mouth daily before breakfast., Disp: 30 tablet, Rfl: 2 .  varenicline (CHANTIX PAK) 0.5 MG X 11 & 1 MG X 42 tablet, Take one 0.5 mg tab PO once daily for 3 days, then increase to one 0.5 mg tab BID for 4 days, then increase to one 1 mg tab BID. (Patient not taking: Reported on 12/02/2017), Disp: 53 tablet, Rfl: 0  Review of Systems  Social History   Tobacco Use  . Smoking  status: Former Smoker    Packs/day: 0.04    Years: 8.00    Pack years: 0.32    Types: Cigarettes    Last attempt to quit: 08/06/2016    Years since quitting: 1.4  . Smokeless tobacco: Never Used  Substance Use Topics  . Alcohol use: No   Objective:   There were no vitals taken for this visit. There were no vitals filed for this visit.   Physical Exam      Assessment & Plan:           Margaretann LovelessJennifer M Burnette, PA-C  [facility]

## 2018-01-04 NOTE — Telephone Encounter (Signed)
Pt could not make the appt today.  She scheduled another appt for Fri. 22. She is asking if the Phentermine 37.5 mg  Could still be called in for her. If it can be filled. Please fill at:  [id number] - [place] - [address] AT [facility] [address] [phone] (Phone) [phone] (Fax)   Thanks, [employer]

## 2018-01-05 ENCOUNTER — Other Ambulatory Visit: Payer: Self-pay | Admitting: Physician Assistant

## 2018-01-05 DIAGNOSIS — Z6841 Body Mass Index (BMI) 40.0 and over, adult: Principal | ICD-10-CM

## 2018-01-12 ENCOUNTER — Encounter: Payer: Self-pay | Admitting: Physician Assistant

## 2018-01-13 ENCOUNTER — Encounter: Payer: Self-pay | Admitting: Physician Assistant

## 2018-01-13 ENCOUNTER — Telehealth: Payer: Self-pay

## 2018-01-13 ENCOUNTER — Ambulatory Visit (INDEPENDENT_AMBULATORY_CARE_PROVIDER_SITE_OTHER): Payer: Medicaid Other | Admitting: Physician Assistant

## 2018-01-13 DIAGNOSIS — Z6841 Body Mass Index (BMI) 40.0 and over, adult: Secondary | ICD-10-CM

## 2018-01-13 MED ORDER — PHENTERMINE HCL 37.5 MG PO TABS: ORAL_TABLET | ORAL | 2 refills | Status: DC

## 2018-01-13 NOTE — Progress Notes (Signed)
Patient: Amanda [name] Female    DOB: [birth year]   27 y.o.   MRN: [id number] Visit Date: 01/13/2018  Today's Provider: Margaretann LovelessJennifer M Casy Brunetto, PA-C   Chief Complaint  Patient presents with  . Follow-up    Weight Loss Counseling   Subjective:    HPI  Weight Loss [place], Follow up:  The patient was last seen for this  1 months ago. Changes made since that visit include restart Phentermine  She reports excellent compliance with treatment. She is not having side effects.   Wt Readings from Last 3 Encounters:  01/13/18 289 lb 6.4 oz (131.3 kg)  12/02/17 (!) 304 lb 3.2 oz (138 kg)  10/16/17 260 lb (117.9 kg)      No Known Allergies   Current Outpatient Medications:  .  albuterol (PROVENTIL HFA;VENTOLIN HFA) 108 (90 Base) MCG/ACT inhaler, Inhale 2 puffs into the lungs every 6 (six) hours as needed for wheezing or shortness of breath., Disp: 1 Inhaler, Rfl: 0 .  cetirizine (ZYRTEC) 10 MG tablet, Take 1 tablet (10 mg total) by mouth daily., Disp: 30 tablet, Rfl: 11 .  cyclobenzaprine (FLEXERIL) 10 MG tablet, Take 1 tablet (10 mg total) by mouth 3 (three) times daily as needed., Disp: 30 tablet, Rfl: 0 .  levonorgestrel (MIRENA) 20 MCG/24HR IUD, 1 each by Intrauterine route once., Disp: , Rfl:  .  omeprazole (PRILOSEC) 40 MG capsule, take 1 capsule by mouth every morning 1 HOUR PRIOR TO BREAKFAST, Disp: 90 capsule, Rfl: 1 .  phentermine (ADIPEX-P) 37.5 MG tablet, TAKE 1 TABLET BY MOUTH ONCE DAILY BEFORE BREAKFAST, Disp: 30 tablet, Rfl: 0 .  ergocalciferol (VITAMIN D2) 50000 units capsule, Take 1 capsule (50,000 Units total) by mouth once a week. (Patient not taking: Reported on 12/02/2017), Disp: 4 capsule, Rfl: 3 .  spironolactone (ALDACTONE) 100 MG tablet, TK 1 T PO HS, Disp: , Rfl: 2 .  varenicline (CHANTIX PAK) 0.5 MG X 11 & 1 MG X 42 tablet, Take one 0.5 mg tab PO once daily for 3 days, then increase to one 0.5 mg tab BID for 4 days, then increase to one 1 mg tab BID.  (Patient not taking: Reported on 12/02/2017), Disp: 53 tablet, Rfl: 0  Review of Systems  Constitutional: Negative.   Respiratory: Negative.   Cardiovascular: Negative.   Gastrointestinal: Negative.   Neurological: Negative.     Social History   Tobacco Use  . Smoking status: Former Smoker    Packs/day: 0.04    Years: 8.00    Pack years: 0.32    Types: Cigarettes    Last attempt to quit: 08/06/2016    Years since quitting: 1.4  . Smokeless tobacco: Never Used  Substance Use Topics  . Alcohol use: No   Objective:   BP 120/80 (BP Location: Left Arm, Patient Position: Sitting, Cuff Size: Large)   Pulse (!) 134   Temp 98.4 F (36.9 C) (Oral)   Resp 16   Wt 289 lb 6.4 oz (131.3 kg)   SpO2 98%   BMI 46.71 kg/m  Vitals:   01/13/18 1506  BP: 120/80  Pulse: (!) 134  Resp: 16  Temp: 98.4 F (36.9 C)  TempSrc: Oral  SpO2: 98%  Weight: 289 lb 6.4 oz (131.3 kg)     Physical Exam  Constitutional: She appears well-developed and well-nourished. No distress.  Neck: Normal range of motion. Neck supple.  Cardiovascular: Normal rate, regular rhythm and normal heart sounds. Exam reveals  no gallop and no friction rub.  No murmur heard. Pulmonary/Chest: Effort normal and breath sounds normal. No respiratory distress. She has no wheezes. She has no rales.  Skin: She is not diaphoretic.  Psychiatric: She has a normal mood and affect. Her behavior is normal. Judgment and thought content normal.  Vitals reviewed.       Assessment & Plan:     1. Class 3 severe obesity due to excess calories without serious comorbidity with body mass index (BMI) of 45.0 to 49.9 in adult [facility]) Doing well. Down 15 pounds since October. Will continue phentermine as below. Continue exercise and portion control. I will see her back in 3 months for weight recheck.  - phentermine (ADIPEX-P) 37.5 MG tablet; TAKE 1 TABLET BY MOUTH ONCE DAILY BEFORE BREAKFAST  Dispense: 30 tablet; Refill: 2        Margaretann Loveless, PA-C  [facility]

## 2018-01-13 NOTE — Telephone Encounter (Signed)
Patient has an appt today at 2:40, and she wants to know if her daughter can get a flu shot when she comes in for her appt? Please advise to let her know if this is ok. Contact number is correct. Thanks!

## 2018-01-13 NOTE — Telephone Encounter (Signed)
Patient advised that it is ok.

## 2018-03-09 ENCOUNTER — Encounter: Payer: Self-pay | Admitting: Physician Assistant

## 2018-03-09 DIAGNOSIS — Z6841 Body Mass Index (BMI) 40.0 and over, adult: Principal | ICD-10-CM

## 2018-03-10 MED ORDER — PHENTERMINE HCL 37.5 MG PO TABS: ORAL_TABLET | ORAL | 0 refills | Status: DC

## 2018-03-13 MED ORDER — PHENTERMINE HCL 37.5 MG PO TABS: ORAL_TABLET | ORAL | 0 refills | Status: DC

## 2018-03-13 NOTE — Addendum Note (Signed)
Addended by: Margaretann Loveless on: 03/13/2018 11:48 AM   Modules accepted: Orders

## 2018-03-31 ENCOUNTER — Other Ambulatory Visit: Payer: Self-pay | Admitting: Physician Assistant

## 2018-03-31 DIAGNOSIS — J301 Allergic rhinitis due to pollen: Secondary | ICD-10-CM

## 2018-05-02 ENCOUNTER — Other Ambulatory Visit: Payer: Self-pay | Admitting: Physician Assistant

## 2018-05-02 DIAGNOSIS — Z6841 Body Mass Index (BMI) 40.0 and over, adult: Principal | ICD-10-CM

## 2018-05-10 ENCOUNTER — Encounter: Payer: Self-pay | Admitting: Physician Assistant

## 2018-05-10 DIAGNOSIS — Z6841 Body Mass Index (BMI) 40.0 and over, adult: Principal | ICD-10-CM

## 2018-05-10 MED ORDER — PHENTERMINE HCL 37.5 MG PO TABS: ORAL_TABLET | ORAL | 2 refills | Status: DC

## 2018-05-10 NOTE — Addendum Note (Signed)
Addended by: Margaretann Loveless on: 05/10/2018 06:25 PM   Modules accepted: Orders

## 2018-05-19 ENCOUNTER — Other Ambulatory Visit: Payer: Self-pay | Admitting: Physician Assistant

## 2018-05-19 DIAGNOSIS — K219 Gastro-esophageal reflux disease without esophagitis: Secondary | ICD-10-CM

## 2018-05-24 ENCOUNTER — Encounter: Payer: Self-pay | Admitting: Physician Assistant

## 2018-05-24 DIAGNOSIS — J301 Allergic rhinitis due to pollen: Secondary | ICD-10-CM

## 2018-05-24 MED ORDER — LEVOCETIRIZINE DIHYDROCHLORIDE 5 MG PO TABS: 5.0000 mg | ORAL_TABLET | Freq: Every evening | ORAL | 1 refills | Status: DC

## 2018-06-20 ENCOUNTER — Telehealth: Payer: Self-pay | Admitting: Obstetrics and Gynecology

## 2018-06-20 NOTE — Telephone Encounter (Signed)
Patient is schedule Friday 06/07/18 at 2pm for removal and reinsertion with AMS

## 2018-07-07 ENCOUNTER — Ambulatory Visit: Payer: Self-pay | Admitting: Obstetrics and Gynecology

## 2018-07-31 ENCOUNTER — Encounter: Payer: Self-pay | Admitting: Physician Assistant

## 2018-07-31 MED ORDER — NALTREXONE-BUPROPION HCL ER 8-90 MG PO TB12: ORAL_TABLET | ORAL | 3 refills | Status: DC

## 2018-07-31 NOTE — Addendum Note (Signed)
Addended by: Mar Daring on: 07/31/2018 06:00 PM   Modules accepted: Orders

## 2018-08-01 MED ORDER — NALTREXONE-BUPROPION HCL ER 8-90 MG PO TB12: ORAL_TABLET | ORAL | 3 refills | Status: DC

## 2018-08-01 NOTE — Addendum Note (Signed)
Addended by: Mar Daring on: 08/01/2018 01:28 PM   Modules accepted: Orders

## 2018-08-24 ENCOUNTER — Encounter: Payer: Self-pay | Admitting: Physician Assistant

## 2018-08-24 ENCOUNTER — Ambulatory Visit (INDEPENDENT_AMBULATORY_CARE_PROVIDER_SITE_OTHER): Payer: Medicaid Other | Admitting: Physician Assistant

## 2018-08-24 DIAGNOSIS — N3090 Cystitis, unspecified without hematuria: Secondary | ICD-10-CM | POA: Diagnosis not present

## 2018-08-24 DIAGNOSIS — B3731 Acute candidiasis of vulva and vagina: Secondary | ICD-10-CM

## 2018-08-24 DIAGNOSIS — B373 Candidiasis of vulva and vagina: Secondary | ICD-10-CM

## 2018-08-24 MED ORDER — SULFAMETHOXAZOLE-TRIMETHOPRIM 800-160 MG PO TABS: 1.0000 | ORAL_TABLET | Freq: Two times a day (BID) | ORAL | 0 refills | Status: AC

## 2018-08-24 MED ORDER — FLUCONAZOLE 150 MG PO TABS: ORAL_TABLET | ORAL | 0 refills | Status: DC

## 2018-08-24 NOTE — Progress Notes (Signed)
Subjective:    Patient ID: Amanda [name], female    DOB: [birth year], 28 y.o.   MRN: [id number]  Amanda Gulashley D Derrig is a 28 y.o. female presenting on 08/24/2018 for Dysuria  Virtual Visit via Telephone Note  I connected with Amanda GulaAshley D Thomure on 08/24/18 at  4:20 PM EDT by telephone and verified that I am speaking with the correct person using two identifiers.   I discussed the limitations, risks, security and privacy concerns of performing an evaluation and management service by telephone and the availability of in person appointments. I also discussed with the patient that there may be a patient responsible charge related to this service. The patient expressed understanding and agreed to proceed.   Patient location: home [facility]  Persons involved in the visit: patient, provider   HPI   Reports history of recurrent UTI. Reports she has foul smelling urine and very yellow urine. Has pain in her lower right quadrant. Typically has back pain and is not having more than her usual. No fevers. No nausea or vomiting. Denies new sexual partners.   Social History   Tobacco Use   Smoking status: Former Smoker    Packs/day: 0.04    Years: 8.00    Pack years: 0.32    Types: Cigarettes    Quit date: 08/06/2016    Years since quitting: 2.0   Smokeless tobacco: Never Used  Substance Use Topics   Alcohol use: No   Drug use: No    Review of Systems Per HPI unless specifically indicated above     Objective:    There were no vitals taken for this visit.  Wt Readings from Last 3 Encounters:  01/13/18 289 lb 6.4 oz (131.3 kg)  12/02/17 (!) 304 lb 3.2 oz (138 kg)  10/16/17 260 lb (117.9 kg)    Physical Exam Results for orders placed or performed during the hospital encounter of 10/16/17  Urinalysis, Complete w Microscopic  Result Value Ref Range   Color, Urine YELLOW (A) YELLOW   APPearance CLOUDY (A) CLEAR   Specific Gravity, Urine 1.023  1.005 - 1.030   pH 6.0 5.0 - 8.0   Glucose, UA NEGATIVE NEGATIVE mg/dL   Hgb urine dipstick NEGATIVE NEGATIVE   Bilirubin Urine NEGATIVE NEGATIVE   Ketones, ur NEGATIVE NEGATIVE mg/dL   Protein, ur 30 (A) NEGATIVE mg/dL   Nitrite NEGATIVE NEGATIVE   Leukocytes, UA SMALL (A) NEGATIVE   RBC / HPF >50 (H) 0 - 5 RBC/hpf   WBC, UA 21-50 0 - 5 WBC/hpf   Bacteria, UA MANY (A) NONE SEEN   Squamous Epithelial / LPF 11-20 0 - 5   Mucus PRESENT   CBC  Result Value Ref Range   WBC 7.8 3.6 - 11.0 K/uL   RBC 3.83 3.80 - 5.20 MIL/uL   Hemoglobin 12.2 12.0 - 16.0 g/dL   HCT 11.933.7 (L) 14.735.0 - 82.947.0 %   MCV 87.9 80.0 - 100.0 fL   MCH 31.8 26.0 - 34.0 pg   MCHC 36.1 (H) 32.0 - 36.0 g/dL   RDW 56.213.7 13.011.5 - 86.514.5 %   Platelets 287 150 - 440 K/uL  Comprehensive metabolic panel  Result Value Ref Range   Sodium 135 135 - 145 mmol/L   Potassium 3.5 3.5 - 5.1 mmol/L   Chloride 106 98 - 111 mmol/L   CO2 26 22 - 32 mmol/L   Glucose, Bld 103 (H) 70 - 99 mg/dL   BUN  14 6 - 20 mg/dL   Creatinine, Ser 0.93 0.44 - 1.00 mg/dL   Calcium 8.2 (L) 8.9 - 10.3 mg/dL   Total Protein 7.0 6.5 - 8.1 g/dL   Albumin 3.6 3.5 - 5.0 g/dL   AST 15 15 - 41 U/L   ALT 18 0 - 44 U/L   Alkaline Phosphatase 46 38 - 126 U/L   Total Bilirubin 0.8 0.3 - 1.2 mg/dL   GFR calc non Af Amer >60 >60 mL/min   GFR calc Af Amer >60 >60 mL/min   Anion gap 3 (L) 5 - 15  Pregnancy, urine POC  Result Value Ref Range   Preg Test, Ur NEGATIVE NEGATIVE      Assessment & Plan:  .1. Cystitis  - sulfamethoxazole-trimethoprim (BACTRIM DS) 800-160 MG tablet; Take 1 tablet by mouth 2 (two) times daily for 3 days.  Dispense: 6 tablet; Refill: 0  2. Vaginal candida  - fluconazole (DIFLUCAN) 150 MG tablet; Take 1 pill on day 1 of symptoms. Take 2nd pill if still having symptoms three days later.  Dispense: 2 tablet; Refill: 0   F/u PRN  Carles Collet, PA-C [facility] 08/24/2018, 4:21 PM

## 2018-10-24 HISTORY — PX: INTRAUTERINE DEVICE (IUD) INSERTION: SHX5877

## 2018-11-01 ENCOUNTER — Encounter: Payer: Self-pay | Admitting: Physician Assistant

## 2018-11-07 ENCOUNTER — Encounter: Payer: Self-pay | Admitting: Physician Assistant

## 2018-11-07 NOTE — Progress Notes (Signed)
Patient: Amanda [name] Female    DOB: 1991/01/31   28 y.o.   MRN: [id number] Visit Date: 11/08/2018  Today's Provider: Shirlee LatchAngela Vollie Aaron, MD   Chief Complaint  Patient presents with  . Contraception   Subjective:    HPI  Contraception Patient presents today for IUD removal and placement.  She has had 2 previous Mirena IUDs.  She is amenorrheic on these.  She denies any complications.  She has had her current one since 2015.  No Known Allergies   Current Outpatient Medications:  .  albuterol (PROVENTIL HFA;VENTOLIN HFA) 108 (90 Base) MCG/ACT inhaler, Inhale 2 puffs into the lungs every 6 (six) hours as needed for wheezing or shortness of breath., Disp: 1 Inhaler, Rfl: 0 .  cephALEXin (KEFLEX) 500 MG capsule, Take 1 capsule (500 mg total) by mouth 2 (two) times daily., Disp: 14 capsule, Rfl: 0 .  cyclobenzaprine (FLEXERIL) 10 MG tablet, Take 1 tablet (10 mg total) by mouth 3 (three) times daily as needed., Disp: 30 tablet, Rfl: 0 .  ergocalciferol (VITAMIN D2) 50000 units capsule, Take 1 capsule (50,000 Units total) by mouth once a week., Disp: 4 capsule, Rfl: 3 .  levocetirizine (XYZAL) 5 MG tablet, Take 1 tablet (5 mg total) by mouth every evening., Disp: 90 tablet, Rfl: 1 .  levonorgestrel (MIRENA) 20 MCG/24HR IUD, 1 each by Intrauterine route once., Disp: , Rfl:  .  Naltrexone-buPROPion HCl ER 8-90 MG TB12, Start with 1 tab PO daily x 1 week, then 1 tab PO BID x 1 week, then 2 tab PO q am and 1 tab PO q pm x 1 week, then 2 tabs PO BID, Disp: 120 tablet, Rfl: 3 .  omeprazole (PRILOSEC) 40 MG capsule, TAKE 1 CAPSULE BY MOUTH EVERY MORNING 1 HOUR BEFORE BREAKFAST, Disp: 90 capsule, Rfl: 1 .  spironolactone (ALDACTONE) 100 MG tablet, TK 1 T PO HS, Disp: , Rfl: 2 .  fluconazole (DIFLUCAN) 150 MG tablet, Take 1 pill on day 1 of symptoms. Take 2nd pill if still having symptoms three days later. (Patient not taking: Reported on 11/08/2018), Disp: 2 tablet, Rfl: 0  Review of Systems   Constitutional: Negative.   Respiratory: Negative.   Genitourinary: Negative.   Neurological: Negative.     Social History   Tobacco Use  . Smoking status: Former Smoker    Packs/day: 0.04    Years: 8.00    Pack years: 0.32    Types: Cigarettes    Quit date: 08/06/2016    Years since quitting: 2.2  . Smokeless tobacco: Never Used  Substance Use Topics  . Alcohol use: No      Objective:   BP 115/78 (BP Location: Left Arm, Patient Position: Sitting, Cuff Size: Large)   Pulse 80   Temp (!) 97.5 F (36.4 C) (Temporal)   Wt 299 lb 9.6 oz (135.9 kg)   SpO2 97%   BMI 48.36 kg/m  Vitals:   11/08/18 1448  BP: 115/78  Pulse: 80  Temp: (!) 97.5 F (36.4 C)  TempSrc: Temporal  SpO2: 97%  Weight: 299 lb 9.6 oz (135.9 kg)  Body mass index is 48.36 kg/m.   Physical Exam Vitals signs reviewed.  Constitutional:      Appearance: Normal appearance.  HENT:     Head: Normocephalic and atraumatic.  Genitourinary:    Comments: GYN:  External genitalia within normal limits.  Vaginal mucosa pink, moist, normal rugae.  Very posterior cervix that is unable  to be reached with our speculums in clinic.  Neurological:     Mental Status: She is alert.      No results found for any visits on 11/08/18.     Assessment & Plan   1. Encounter for removal and reinsertion of intrauterine contraceptive device (IUD) -Patient presented today for IUD removal and replacement, but given her habitus, we are unable to reach her cervix with her current speculums available and she may also need sidewall retraction which we do not have - Advised her to see gynecology for IUD removal and reinsertion   Return if symptoms worsen or fail to improve.   The entirety of the information documented in the History of Present Illness, Review of Systems and Physical Exam were personally obtained by me. Portions of this information were initially documented by [facility], CMA and reviewed by me for  thoroughness and accuracy.    Gryffin Altice, Dionne Bucy, MD MPH [facility]

## 2018-11-08 ENCOUNTER — Ambulatory Visit (INDEPENDENT_AMBULATORY_CARE_PROVIDER_SITE_OTHER): Payer: Medicaid Other | Admitting: Family Medicine

## 2018-11-08 ENCOUNTER — Telehealth: Payer: Self-pay | Admitting: Obstetrics and Gynecology

## 2018-11-08 ENCOUNTER — Encounter: Payer: Self-pay | Admitting: Family Medicine

## 2018-11-08 ENCOUNTER — Telehealth: Payer: Self-pay | Admitting: Physician Assistant

## 2018-11-08 ENCOUNTER — Other Ambulatory Visit: Payer: Self-pay

## 2018-11-08 ENCOUNTER — Encounter: Payer: Self-pay | Admitting: Physician Assistant

## 2018-11-08 VITALS — BP 115/78 | HR 80 | Temp 97.5°F | Wt 299.6 lb

## 2018-11-08 DIAGNOSIS — Z30431 Encounter for routine checking of intrauterine contraceptive device: Secondary | ICD-10-CM | POA: Diagnosis not present

## 2018-11-08 DIAGNOSIS — R3989 Other symptoms and signs involving the genitourinary system: Secondary | ICD-10-CM

## 2018-11-08 MED ORDER — CEPHALEXIN 500 MG PO CAPS: 500.0000 mg | ORAL_CAPSULE | Freq: Two times a day (BID) | ORAL | 0 refills | Status: DC

## 2018-11-08 NOTE — Telephone Encounter (Signed)
Please advise   Thanks,    -Laura  

## 2018-11-08 NOTE — Telephone Encounter (Signed)
Patient is schedule with SDJ for Mirena replacement on 11/20/18

## 2018-11-08 NOTE — Telephone Encounter (Signed)
Pt advised.   Thanks,   -Ilisa Hayworth  

## 2018-11-08 NOTE — Telephone Encounter (Signed)
Pt wants to know if she needing to take any pain relievers before her IUD replacement today?  Please call pt back.  Thanks, [employer]

## 2018-11-08 NOTE — Telephone Encounter (Signed)
She doesn't have to, but she can take Ibuprofen 600-800mg  1 hour before which may help with cramping

## 2018-11-15 ENCOUNTER — Encounter: Payer: Self-pay | Admitting: Physician Assistant

## 2018-11-20 ENCOUNTER — Other Ambulatory Visit: Payer: Self-pay

## 2018-11-20 ENCOUNTER — Encounter: Payer: Self-pay | Admitting: Obstetrics and Gynecology

## 2018-11-20 ENCOUNTER — Ambulatory Visit (INDEPENDENT_AMBULATORY_CARE_PROVIDER_SITE_OTHER): Payer: Medicaid Other | Admitting: Obstetrics and Gynecology

## 2018-11-20 VITALS — BP 124/78 | Ht 65.0 in | Wt 301.0 lb

## 2018-11-20 DIAGNOSIS — Z30433 Encounter for removal and reinsertion of intrauterine contraceptive device: Secondary | ICD-10-CM | POA: Diagnosis not present

## 2018-11-20 MED ORDER — LEVONORGESTREL 20 MCG/24HR IU IUD: 1.0000 | INTRAUTERINE_SYSTEM | Freq: Once | INTRAUTERINE | 0 refills | Status: AC

## 2018-11-20 NOTE — Progress Notes (Signed)
   IUD Removal  Patient identified, informed consent performed, consent signed.  Patient was in the dorsal lithotomy position, normal external genitalia was noted.  A speculum was placed in the patient's vagina, normal discharge was noted, no lesions. The cervix was visualized, no lesions, no abnormal discharge.  The strings of the IUD were grasped and pulled using ring forceps. The IUD was removed in its entirety. Patient tolerated the procedure well.    IUD Insertion Procedure Note Patient identified, informed consent performed, consent signed.   Discussed risks of irregular bleeding, cramping, infection, malpositioning, expulsion or uterine perforation of the IUD (1:1000 placements)  which may require further procedure such as laparoscopy.  IUD while effective at preventing pregnancy do not prevent transmission of sexually transmitted diseases and use of barrier methods for this purpose was discussed. Time out was performed.  Urine pregnancy test negative.  Speculum already placed in the vagina.  Cervix previously visualized.  Cleaned with Betadine x 2.  Grasped anteriorly with a single tooth tenaculum.  Uterus sounded to 8.5 cm. IUD placed per manufacturer's recommendations.  A small amount of dilation was necessary in order to allow the IUD to be inserted without any force.  Strings trimmed to 3 cm. Tenaculum was removed, good hemostasis noted after the application of silver nitrate.  Patient tolerated procedure well.   Patient was given post-procedure instructions.  She was advised to have backup contraception for one week.  Patient was also asked to check IUD strings periodically and follow up in 4 weeks for IUD check.  Prentice Docker, MD, [facility], [facility] 11/20/2018 3:12 PM    CC: [address] [place],  [place] [id number]

## 2018-11-21 ENCOUNTER — Other Ambulatory Visit: Payer: Self-pay | Admitting: Physician Assistant

## 2018-11-21 DIAGNOSIS — K219 Gastro-esophageal reflux disease without esophagitis: Secondary | ICD-10-CM

## 2018-11-23 ENCOUNTER — Telehealth: Payer: Self-pay

## 2018-11-23 NOTE — Telephone Encounter (Signed)
Pt calling; still spotting today after having mirean replaced Monday.  Is this normal?  [phone] Pt aware normal.  To let us know if continues after two weeks or is problematic.

## 2018-11-25 ENCOUNTER — Encounter: Payer: Self-pay | Admitting: Physician Assistant

## 2018-11-25 DIAGNOSIS — B9689 Other specified bacterial agents as the cause of diseases classified elsewhere: Secondary | ICD-10-CM

## 2018-11-25 DIAGNOSIS — L600 Ingrowing nail: Secondary | ICD-10-CM

## 2018-11-25 DIAGNOSIS — N760 Acute vaginitis: Secondary | ICD-10-CM

## 2018-11-27 MED ORDER — METRONIDAZOLE 500 MG PO TABS: 500.0000 mg | ORAL_TABLET | Freq: Two times a day (BID) | ORAL | 0 refills | Status: DC

## 2018-11-27 NOTE — Addendum Note (Signed)
Addended by: Mar Daring on: 11/27/2018 01:37 PM   Modules accepted: Orders

## 2018-12-01 ENCOUNTER — Encounter: Payer: Self-pay | Admitting: Physician Assistant

## 2018-12-01 NOTE — Telephone Encounter (Signed)
Jasmine Pang do you know which Podiatrist patient is going to go?

## 2018-12-18 ENCOUNTER — Ambulatory Visit: Payer: No Typology Code available for payment source | Admitting: Obstetrics and Gynecology

## 2018-12-18 ENCOUNTER — Other Ambulatory Visit: Payer: Self-pay

## 2018-12-18 ENCOUNTER — Ambulatory Visit (INDEPENDENT_AMBULATORY_CARE_PROVIDER_SITE_OTHER): Payer: Medicaid Other | Admitting: Obstetrics and Gynecology

## 2018-12-18 ENCOUNTER — Encounter: Payer: Self-pay | Admitting: Obstetrics and Gynecology

## 2018-12-18 VITALS — BP 110/80 | Ht 65.0 in | Wt 306.0 lb

## 2018-12-18 DIAGNOSIS — Z30431 Encounter for routine checking of intrauterine contraceptive device: Secondary | ICD-10-CM | POA: Diagnosis not present

## 2018-12-18 NOTE — Progress Notes (Signed)
   IUD String Check  Subjctive: [name] presents for IUD string check.  She had a Mirena placed 4 weeks ago.  Since placement of her IUD she had only a small amount of vaginal bleeding.  She denies cramping or discomfort.  She has had intercourse since placement.  She has not checked the strings.  She denies any fever, chills, nausea, vomiting, or other complaints.    Objective: BP 110/80   Ht 5\' 5"  (1.651 m)   Wt (!) 306 lb (138.8 kg)   BMI 50.92 kg/m  Physical Exam Exam conducted with a chaperone present.  Constitutional:      General: She is not in acute distress.    Appearance: Normal appearance.  HENT:     Head: Normocephalic and atraumatic.  Eyes:     General: No scleral icterus.    Conjunctiva/sclera: Conjunctivae normal.  Pulmonary:     Effort: Pulmonary effort is normal.  Abdominal:     Palpations: Abdomen is soft.     Tenderness: There is no abdominal tenderness.  Genitourinary:    General: Normal vulva.     Exam position: Lithotomy position.     Tanner stage (genital): 5.     Labia:        Right: No rash, tenderness, lesion or injury.        Left: No rash, tenderness, lesion or injury.      Vagina: Normal.     Cervix: Normal.     Uterus: Normal.      Comments: IUD strings visualized and about 3 cm long Musculoskeletal: Normal range of motion.        General: No deformity.  Skin:    General: Skin is warm and dry.     Coloration: Skin is not jaundiced.  Neurological:     General: No focal deficit present.     Mental Status: She is alert and oriented to person, place, and time.     Cranial Nerves: No cranial nerve deficit.  Psychiatric:        Mood and Affect: Mood normal.        Behavior: Behavior normal.        Judgment: Judgment normal.     Female chaperone was present for the entirety of the pelvic exam  Assessment: 27 y.o. year old female status post prior Mirena IUD placement 4 week ago, doing well.  Plan: 1.  The patient was given  instructions to check her IUD strings monthly and call with any problems or concerns.  She should call for fevers, chills, abnormal vaginal discharge, pelvic pain, or other complaints. 2.  She will return for a annual exam in 1 year.  All questions answered.  15 minutes spent in face to face discussion with > 50% spent in counseling, management, and coordination of care for her newly-placed IUD.  Risks and benefits of IUD discussed including the risks of irregular bleeding, cramping, infection, malpositioning, expulsion, which may require further procedures such as laparoscopy.  IUDs while effective at preventing pregnancy do not prevent transmission of sexually transmitted diseases and use of barrier methods for this purpose was discussed.  Low overall incidence of failure with 99.7% efficacy rate in typical use.    Prentice Docker, MD 12/18/2018 2:42 PM

## 2018-12-20 ENCOUNTER — Ambulatory Visit: Payer: No Typology Code available for payment source | Admitting: Podiatry

## 2018-12-21 ENCOUNTER — Encounter: Payer: Self-pay | Admitting: Podiatry

## 2018-12-21 ENCOUNTER — Ambulatory Visit (INDEPENDENT_AMBULATORY_CARE_PROVIDER_SITE_OTHER): Payer: Medicaid Other | Admitting: Podiatry

## 2018-12-21 ENCOUNTER — Other Ambulatory Visit: Payer: Self-pay

## 2018-12-21 DIAGNOSIS — L600 Ingrowing nail: Secondary | ICD-10-CM

## 2018-12-21 DIAGNOSIS — M79675 Pain in left toe(s): Secondary | ICD-10-CM

## 2018-12-21 NOTE — Progress Notes (Signed)
Subjective:  Patient ID: Amanda [name], female    DOB: [birth year],  MRN: [id number]  Chief Complaint  Patient presents with  . Nail Problem    Patient presents today for ingrown toenail left hallux medial border x years off and on, but has become worse over the past several months.  She reports some tenderness to toe today.  she has been cutting out ingrowns herself for treatment    28 y.o. female presents with the above complaint.  I agree with the above statement.  She has done the other side of the ingrown toenail a long time ago.  She denies any other acute complaints.  She denies any other acute treatments to the area.   Review of Systems: Negative except as noted in the HPI. Denies N/V/F/Ch.  Past Medical History:  Diagnosis Date  . ADD (attention deficit disorder)   . Allergy   . Asthma   . Blood transfusion without reported diagnosis   . Heart murmur   . Hypertension   . Obesity     Current Outpatient Medications:  .  albuterol (PROVENTIL HFA;VENTOLIN HFA) 108 (90 Base) MCG/ACT inhaler, Inhale 2 puffs into the lungs every 6 (six) hours as needed for wheezing or shortness of breath., Disp: 1 Inhaler, Rfl: 0 .  levocetirizine (XYZAL) 5 MG tablet, Take 1 tablet (5 mg total) by mouth every evening., Disp: 90 tablet, Rfl: 1 .  levonorgestrel (MIRENA) 20 MCG/24HR IUD, 1 Intra Uterine Device (1 each total) by Intrauterine route once for 1 dose., Disp: 1 each, Rfl: 0 .  omeprazole (PRILOSEC) 40 MG capsule, TAKE 1 CAPSULE BY MOUTH EVERY MORNING 1 HOUR BEFORE BREAKFAST, Disp: 90 capsule, Rfl: 1  Social History   Tobacco Use  Smoking Status Former Smoker  . Packs/day: 0.04  . Years: 8.00  . Pack years: 0.32  . Types: Cigarettes  . Quit date: 08/06/2016  . Years since quitting: 2.3  Smokeless Tobacco Never Used    No Known Allergies Objective:  There were no vitals filed for this visit. There is no height or weight on file to calculate BMI. Constitutional Well developed.  Well nourished.  Vascular Dorsalis pedis pulses palpable bilaterally. Posterior tibial pulses palpable bilaterally. Capillary refill normal to all digits.  No cyanosis or clubbing noted. Pedal hair growth normal.  Neurologic Normal speech. Oriented to person, place, and time. Epicritic sensation to light touch grossly present bilaterally.  Dermatologic Painful ingrowing nail at medial nail borders of the hallux nail left. No other open wounds. No skin lesions.  Orthopedic: Normal joint ROM without pain or crepitus bilaterally. No visible deformities. No bony tenderness.   Radiographs: None Assessment:   1. Ingrown left big toenail   2. Pain of left great toe    Plan:  Patient was evaluated and treated and all questions answered.  Ingrown Nail, left -Patient elects to proceed with minor surgery to remove ingrown toenail removal today. Consent reviewed and signed by patient. -Ingrown nail excised. See procedure note. -Educated on post-procedure care including soaking. Written instructions provided and reviewed. -Patient to follow up in 2 weeks for nail check.  Procedure: Excision of Ingrown Toenail Location: Left 1st toe medial nail borders. Anesthesia: Lidocaine 1% plain; 1.5 mL and Marcaine 0.5% plain; 1.5 mL, digital block. Skin Prep: Betadine. Dressing: Silvadene; telfa; dry, sterile, compression dressing. Technique: Following skin prep, the toe was exsanguinated and a tourniquet was secured at the base of the toe. The affected nail border was freed, split with  a nail splitter, and excised. Chemical matrixectomy was then performed with phenol and irrigated out with alcohol. The tourniquet was then removed and sterile dressing applied. Disposition: Patient tolerated procedure well. Patient to return in 2 weeks for follow-up.   No follow-ups on file.

## 2018-12-29 ENCOUNTER — Encounter: Payer: Self-pay | Admitting: Physician Assistant

## 2019-01-04 ENCOUNTER — Ambulatory Visit: Payer: Medicaid Other | Admitting: Podiatry

## 2019-01-04 ENCOUNTER — Other Ambulatory Visit: Payer: Self-pay | Admitting: Physician Assistant

## 2019-01-04 DIAGNOSIS — J301 Allergic rhinitis due to pollen: Secondary | ICD-10-CM

## 2019-01-12 ENCOUNTER — Other Ambulatory Visit: Payer: Self-pay

## 2019-01-12 ENCOUNTER — Ambulatory Visit (INDEPENDENT_AMBULATORY_CARE_PROVIDER_SITE_OTHER): Payer: Medicaid Other | Admitting: Physician Assistant

## 2019-01-12 ENCOUNTER — Encounter: Payer: Self-pay | Admitting: Physician Assistant

## 2019-01-12 VITALS — Ht 65.0 in | Wt 315.0 lb

## 2019-01-12 DIAGNOSIS — Z713 Dietary counseling and surveillance: Secondary | ICD-10-CM

## 2019-01-12 DIAGNOSIS — E66813 Obesity, class 3: Secondary | ICD-10-CM

## 2019-01-12 DIAGNOSIS — Z6841 Body Mass Index (BMI) 40.0 and over, adult: Secondary | ICD-10-CM

## 2019-01-12 MED ORDER — PHENTERMINE HCL 37.5 MG PO TABS: 37.5000 mg | ORAL_TABLET | Freq: Every day | ORAL | 2 refills | Status: DC

## 2019-01-12 NOTE — Progress Notes (Signed)
Patient: Amanda [name] Female    DOB: [birth year]   28 y.o.   MRN: [id number] Visit Date: 01/12/2019  Today's Provider: Mar Daring, PA-C   No chief complaint on file.  Subjective:     Virtual Visit via Telephone Note  I connected with Amanda [name] on 01/12/19 at  1:40 PM EST by telephone and verified that I am speaking with the correct person using two identifiers.  Location: Patient: home Provider: [facility]   I discussed the limitations, risks, security and privacy concerns of performing an evaluation and management service by telephone and the availability of in person appointments. I also discussed with the patient that there may be a patient responsible charge related to this service. The patient expressed understanding and agreed to proceed.  HPI  Patient wanting to restart phentermine for weight loss. Has used successfully in the past. Has gained from 299 pounds to 315 pounds since September 2020. Has restarted a food diary and is trying to exercise at home.    No Known Allergies   Current Outpatient Medications:  .  albuterol (PROVENTIL HFA;VENTOLIN HFA) 108 (90 Base) MCG/ACT inhaler, Inhale 2 puffs into the lungs every 6 (six) hours as needed for wheezing or shortness of breath., Disp: 1 Inhaler, Rfl: 0 .  levocetirizine (XYZAL) 5 MG tablet, TAKE 1 TABLET(5 MG) BY MOUTH EVERY EVENING, Disp: 90 tablet, Rfl: 1 .  levonorgestrel (MIRENA) 20 MCG/24HR IUD, 1 Intra Uterine Device (1 each total) by Intrauterine route once for 1 dose., Disp: 1 each, Rfl: 0 .  omeprazole (PRILOSEC) 40 MG capsule, TAKE 1 CAPSULE BY MOUTH EVERY MORNING 1 HOUR BEFORE BREAKFAST, Disp: 90 capsule, Rfl: 1 .  phentermine (ADIPEX-P) 37.5 MG tablet, Take 1 tablet (37.5 mg total) by mouth daily before breakfast., Disp: 30 tablet, Rfl: 2  Review of Systems  Constitutional: Negative.   Respiratory: Negative.   Cardiovascular: Negative.   Gastrointestinal: Negative.   Psychiatric/Behavioral:  Negative.     Social History   Tobacco Use  . Smoking status: Former Smoker    Packs/day: 0.04    Years: 8.00    Pack years: 0.32    Types: Cigarettes    Quit date: 08/06/2016    Years since quitting: 2.4  . Smokeless tobacco: Never Used  Substance Use Topics  . Alcohol use: No      Objective:   Ht 5\' 5"  (1.651 m)   Wt (!) 315 lb (142.9 kg)   BMI 52.42 kg/m  Vitals:   01/12/19 1344  Weight: (!) 315 lb (142.9 kg)  Height: 5\' 5"  (1.651 m)  Body mass index is 52.42 kg/m.   Physical Exam Vitals signs reviewed.  Constitutional:      General: She is not in acute distress.    Appearance: She is obese.  Pulmonary:     Effort: No respiratory distress.  Neurological:     Mental Status: She is alert.      No results found for any visits on 01/12/19.     Assessment & Plan     1. Class 3 severe obesity due to excess calories with serious comorbidity and body mass index (BMI) of 50.0 to 59.9 in adult [facility]) Restart phentermine as below. Start food diary and try to stay between 1200-1400 calories per day. Increase physical activity slowly as tolerated to try to get 150 min of moderate activity per week. Return in 3 months for weight f/u.  - phentermine (ADIPEX-P) 37.5  MG tablet; Take 1 tablet (37.5 mg total) by mouth daily before breakfast.  Dispense: 30 tablet; Refill: 2  2. Encounter for weight loss counseling See above medical treatment plan. - phentermine (ADIPEX-P) 37.5 MG tablet; Take 1 tablet (37.5 mg total) by mouth daily before breakfast.  Dispense: 30 tablet; Refill: 2   I discussed the assessment and treatment plan with the patient. The patient was provided an opportunity to ask questions and all were answered. The patient agreed with the plan and demonstrated an understanding of the instructions.   The patient was advised to call back or seek an in-person evaluation if the symptoms worsen or if the condition fails to improve as anticipated.  I provided 15  minutes of non-face-to-face time during this encounter.    Margaretann Loveless, PA-C  [facility]

## 2019-02-06 ENCOUNTER — Telehealth: Payer: Self-pay

## 2019-02-06 NOTE — Telephone Encounter (Signed)
I am not sure why she was not asked about symptoms.  She has appointment for ear pain??  Copied from [place] [phone]. Topic: Clinical - COVID Pre-Screen >> Feb 06, 2019  2:32 PM Mathis Bud wrote: 1. To the best of your knowledge, have you been in close contact with anyone with a confirmed diagnosis of COVID 19?  no  2. Have you had any one or more of the following: fever, chills, cough, shortness of breath or any flu-like symptoms?  no  3. Have you been diagnosed with or have a previous diagnosis of COVID 19?  NO 4. I am going to go over a few other symptoms with you. Please let me know if you are experiencing any of the following: NO

## 2019-02-07 NOTE — Progress Notes (Signed)
Patient: Amanda [name] Female    DOB: [birth year]   28 y.o.   MRN: [id number] Visit Date: 02/08/2019  Today's Provider: Mar Daring, PA-C   Chief Complaint  Patient presents with  . Otalgia   Subjective:     Virtual Visit via Telephone Note  I connected with Amanda [name] on 02/08/19 at  3:20 PM EST by telephone and verified that I am speaking with the correct person using two identifiers.  Location: Patient: Home Provider: [facility]   I discussed the limitations, risks, security and privacy concerns of performing an evaluation and management service by telephone and the availability of in person appointments. I also discussed with the patient that there may be a patient responsible charge related to this service. The patient expressed understanding and agreed to proceed.   Otalgia  There is pain in the right ear. This is a recurrent problem. There has been no fever. Pertinent negatives include no coughing, rhinorrhea or sore throat. Associated symptoms comments: Feels swollen. She has tried nothing for the symptoms.     No Known Allergies   Current Outpatient Medications:  .  albuterol (PROVENTIL HFA;VENTOLIN HFA) 108 (90 Base) MCG/ACT inhaler, Inhale 2 puffs into the lungs every 6 (six) hours as needed for wheezing or shortness of breath., Disp: 1 Inhaler, Rfl: 0 .  levocetirizine (XYZAL) 5 MG tablet, TAKE 1 TABLET(5 MG) BY MOUTH EVERY EVENING, Disp: 90 tablet, Rfl: 1 .  omeprazole (PRILOSEC) 40 MG capsule, TAKE 1 CAPSULE BY MOUTH EVERY MORNING 1 HOUR BEFORE BREAKFAST, Disp: 90 capsule, Rfl: 1 .  phentermine (ADIPEX-P) 37.5 MG tablet, Take 1 tablet (37.5 mg total) by mouth daily before breakfast., Disp: 30 tablet, Rfl: 2 .  levonorgestrel (MIRENA) 20 MCG/24HR IUD, 1 Intra Uterine Device (1 each total) by Intrauterine route once for 1 dose., Disp: 1 each, Rfl: 0 .  neomycin-polymyxin-hydrocortisone (CORTISPORIN) OTIC solution, Place 3 drops into the right ear 3 (three)  times daily., Disp: 10 mL, Rfl: 0  Review of Systems  Constitutional: Negative for fever.  HENT: Positive for ear pain. Negative for congestion, postnasal drip, rhinorrhea, sinus pressure, sinus pain and sore throat.   Respiratory: Negative for cough, chest tightness, shortness of breath and wheezing.   Cardiovascular: Negative for chest pain, palpitations and leg swelling.    Social History   Tobacco Use  . Smoking status: Former Smoker    Packs/day: 0.04    Years: 8.00    Pack years: 0.32    Types: Cigarettes    Quit date: 08/06/2016    Years since quitting: 2.5  . Smokeless tobacco: Never Used  Substance Use Topics  . Alcohol use: No      Objective:   There were no vitals taken for this visit. There were no vitals filed for this visit.There is no height or weight on file to calculate BMI.   Physical Exam Vitals reviewed.  Constitutional:      General: She is not in acute distress. Pulmonary:     Effort: No respiratory distress.  Neurological:     Mental Status: She is alert.      No results found for any visits on 02/08/19.     Assessment & Plan    1. Acute swimmer's ear of right side Suspect from shower. Advised to try keep water out of the ear when showering. Cortisporin drops given as below. Call if not improving. - neomycin-polymyxin-hydrocortisone (CORTISPORIN) OTIC solution; Place 3 drops into  the right ear 3 (three) times daily.  Dispense: 10 mL; Refill: 0  2. Class 3 severe obesity due to excess calories with serious comorbidity and body mass index (BMI) of 45.0 to 49.9 in adult [facility]) Doing well. HAs lost almost 16 pounds since last weight check. Continue Phentermine as below.  - phentermine (ADIPEX-P) 37.5 MG tablet; Take 1 tablet (37.5 mg total) by mouth daily before breakfast.  Dispense: 30 tablet; Refill: 2  3. Encounter for weight loss counseling See above medical treatment plan. - phentermine (ADIPEX-P) 37.5 MG tablet; Take 1 tablet (37.5 mg  total) by mouth daily before breakfast.  Dispense: 30 tablet; Refill: 2  I discussed the assessment and treatment plan with the patient. The patient was provided an opportunity to ask questions and all were answered. The patient agreed with the plan and demonstrated an understanding of the instructions.   The patient was advised to call back or seek an in-person evaluation if the symptoms worsen or if the condition fails to improve as anticipated.  I provided 18 minutes of non-face-to-face time during this encounter.    Margaretann Loveless, PA-C  [facility]

## 2019-02-08 ENCOUNTER — Encounter: Payer: Self-pay | Admitting: Physician Assistant

## 2019-02-08 ENCOUNTER — Ambulatory Visit (INDEPENDENT_AMBULATORY_CARE_PROVIDER_SITE_OTHER): Payer: Medicaid Other | Admitting: Physician Assistant

## 2019-02-08 VITALS — Wt 299.6 lb

## 2019-02-08 DIAGNOSIS — Z6841 Body Mass Index (BMI) 40.0 and over, adult: Secondary | ICD-10-CM

## 2019-02-08 DIAGNOSIS — Z713 Dietary counseling and surveillance: Secondary | ICD-10-CM

## 2019-02-08 DIAGNOSIS — H60331 Swimmer's ear, right ear: Secondary | ICD-10-CM | POA: Diagnosis not present

## 2019-02-08 MED ORDER — NEOMYCIN-POLYMYXIN-HC 3.5-10000-1 OT SOLN: 3.0000 [drp] | Freq: Three times a day (TID) | OTIC | 0 refills | Status: DC

## 2019-02-08 MED ORDER — PHENTERMINE HCL 37.5 MG PO TABS: 37.5000 mg | ORAL_TABLET | Freq: Every day | ORAL | 2 refills | Status: DC

## 2019-02-08 NOTE — Patient Instructions (Signed)
Otitis Externa  Otitis externa is an infection of the outer ear canal. The outer ear canal is the area between the outside of the ear and the eardrum. Otitis externa is sometimes called swimmer's ear. What are the causes? Common causes of this condition include:  Swimming in dirty water.  Moisture in the ear.  An injury to the inside of the ear.  An object stuck in the ear.  A cut or scrape on the outside of the ear. What increases the risk? You are more likely to develop this condition if you go swimming often. What are the signs or symptoms? The first symptom of this condition is often itching in the ear. Later symptoms of the condition include:  Swelling of the ear.  Redness in the ear.  Ear pain. The pain may get worse when you pull on your ear.  Pus coming from the ear. How is this diagnosed? This condition may be diagnosed by examining the ear and testing fluid from the ear for bacteria and funguses. How is this treated? This condition may be treated with:  Antibiotic ear drops. These are often given for 10-14 days.  Medicines to reduce itching and swelling. Follow these instructions at home:  If you were prescribed antibiotic ear drops, use them as told by your health care provider. Do not stop using the antibiotic even if your condition improves.  Take over-the-counter and prescription medicines only as told by your health care provider.  Avoid getting water in your ears as told by your health care provider. This may include avoiding swimming or water sports for a few days.  Keep all follow-up visits as told by your health care provider. This is important. How is this prevented?  Keep your ears dry. Use the corner of a towel to dry your ears after you swim or bathe.  Avoid scratching or putting things in your ear. Doing these things can damage the ear canal or remove the protective wax that lines it, which makes it easier for bacteria and funguses to grow.   Avoid swimming in lakes, polluted water, or pools that may not have enough chlorine. Contact a health care provider if:  You have a fever.  Your ear is still red, swollen, painful, or draining pus after 3 days.  Your redness, swelling, or pain gets worse.  You have a severe headache.  You have redness, swelling, pain, or tenderness in the area behind your ear. Summary  Otitis externa is an infection of the outer ear canal.  Common causes include swimming in dirty water, moisture in the ear, or a cut or scrape in the ear.  Symptoms include pain, redness, and swelling of the ear.  If you were prescribed antibiotic ear drops, use them as told by your health care provider. Do not stop using the antibiotic even if your condition improves. This information is not intended to replace advice given to you by your health care provider. Make sure you discuss any questions you have with your health care provider. Document Released: 02/08/2005 Document Revised: 07/15/2017 Document Reviewed: 07/15/2017 Elsevier Patient Education  2020 [employer]  

## 2019-04-09 ENCOUNTER — Encounter: Payer: Self-pay | Admitting: Physician Assistant

## 2019-04-09 DIAGNOSIS — R079 Chest pain, unspecified: Secondary | ICD-10-CM

## 2019-04-09 DIAGNOSIS — I071 Rheumatic tricuspid insufficiency: Secondary | ICD-10-CM

## 2019-04-10 NOTE — Addendum Note (Signed)
Addended by: Margaretann Loveless on: 04/10/2019 11:00 AM   Modules accepted: Orders

## 2019-04-12 ENCOUNTER — Ambulatory Visit: Payer: Medicaid Other | Admitting: Cardiology

## 2019-04-16 ENCOUNTER — Ambulatory Visit: Payer: Medicaid Other | Admitting: Cardiology

## 2019-04-17 ENCOUNTER — Other Ambulatory Visit: Payer: Self-pay

## 2019-04-17 ENCOUNTER — Ambulatory Visit: Payer: Medicaid Other | Admitting: Cardiovascular Disease

## 2019-04-18 ENCOUNTER — Encounter: Payer: Self-pay | Admitting: Physician Assistant

## 2019-04-19 ENCOUNTER — Telehealth (INDEPENDENT_AMBULATORY_CARE_PROVIDER_SITE_OTHER): Payer: Medicaid Other | Admitting: Physician Assistant

## 2019-04-19 ENCOUNTER — Encounter: Payer: Self-pay | Admitting: Physician Assistant

## 2019-04-19 DIAGNOSIS — Z713 Dietary counseling and surveillance: Secondary | ICD-10-CM

## 2019-04-19 DIAGNOSIS — Z6841 Body Mass Index (BMI) 40.0 and over, adult: Secondary | ICD-10-CM | POA: Diagnosis not present

## 2019-04-19 MED ORDER — NALTREXONE HCL 50 MG PO TABS: 25.0000 mg | ORAL_TABLET | Freq: Every day | ORAL | 0 refills | Status: DC

## 2019-04-19 MED ORDER — BUPROPION HCL ER (XL) 300 MG PO TB24: 300.0000 mg | ORAL_TABLET | Freq: Every day | ORAL | 0 refills | Status: DC

## 2019-04-19 NOTE — Progress Notes (Signed)
Patient: Amanda [name] Female    DOB: [birth year]   29 y.o.   MRN: [id number] Visit Date: 04/19/2019  Today's Provider: Margaretann Loveless, PA-C   Chief Complaint  Patient presents with  . Obesity   Subjective:    Virtual Visit via Video Note  I connected with Amanda [name] on 04/19/19 at  6:00 PM EST by a video enabled telemedicine application and verified that I am speaking with the correct person using two identifiers.  Location: Patient: Home Provider: [facility]   I discussed the limitations of evaluation and management by telemedicine and the availability of in person appointments. The patient expressed understanding and agreed to proceed.   HPI   Amanda [name] presents via virtual appt today to discuss weight loss options. She has been using phentermine off and on for a while without any great success. She tries to eat healthier but reports snacking is a problem. She is trying to make healthier snack choices. She is exercising 3 times per week.    No Known Allergies   Current Outpatient Medications:  .  albuterol (PROVENTIL HFA;VENTOLIN HFA) 108 (90 Base) MCG/ACT inhaler, Inhale 2 puffs into the lungs every 6 (six) hours as needed for wheezing or shortness of breath., Disp: 1 Inhaler, Rfl: 0 .  levocetirizine (XYZAL) 5 MG tablet, TAKE 1 TABLET(5 MG) BY MOUTH EVERY EVENING, Disp: 90 tablet, Rfl: 1 .  omeprazole (PRILOSEC) 40 MG capsule, TAKE 1 CAPSULE BY MOUTH EVERY MORNING 1 HOUR BEFORE BREAKFAST, Disp: 90 capsule, Rfl: 1 .  phentermine (ADIPEX-P) 37.5 MG tablet, Take 1 tablet (37.5 mg total) by mouth daily before breakfast., Disp: 30 tablet, Rfl: 2 .  levonorgestrel (MIRENA) 20 MCG/24HR IUD, 1 Intra Uterine Device (1 each total) by Intrauterine route once for 1 dose., Disp: 1 each, Rfl: 0 .  neomycin-polymyxin-hydrocortisone (CORTISPORIN) OTIC solution, Place 3 drops into the right ear 3 (three) times daily., Disp: 10 mL, Rfl: 0  Review of Systems  Constitutional:  Negative.   Respiratory: Negative.   Cardiovascular: Negative.   Gastrointestinal: Negative.   Neurological: Negative for dizziness, light-headedness and headaches.    Social History   Tobacco Use  . Smoking status: Former Smoker    Packs/day: 0.04    Years: 8.00    Pack years: 0.32    Types: Cigarettes    Quit date: 08/06/2016    Years since quitting: 2.7  . Smokeless tobacco: Never Used  Substance Use Topics  . Alcohol use: No      Objective:   There were no vitals taken for this visit. There were no vitals filed for this visit.There is no height or weight on file to calculate BMI.   Physical Exam Vitals reviewed.  Constitutional:      General: She is not in acute distress.    Appearance: Normal appearance. She is well-developed. She is obese. She is not ill-appearing.  HENT:     Head: Normocephalic and atraumatic.  Pulmonary:     Effort: Pulmonary effort is normal. No respiratory distress.  Musculoskeletal:     Cervical back: Normal range of motion and neck supple.  Neurological:     Mental Status: She is alert.  Psychiatric:        Mood and Affect: Mood normal.        Behavior: Behavior normal.        Thought Content: Thought content normal.        Judgment: Judgment normal.  No results found for any visits on 04/19/19.     Assessment & Plan    1. Class 3 severe obesity due to excess calories with serious comorbidity and body mass index (BMI) of 45.0 to 49.9 in adult [facility]) Patient has tried and failed phentermine multiple times. Have discussed bariatric surgery, but she declines that at this time. Since patient is on [employer] it is hard to have other choices for medication assisted weight loss as they are not covered. Will try to improvise a dose close to contrave to see how she tolerates this. F/u in 4 weeks.  - buPROPion (WELLBUTRIN XL) 300 MG 24 hr tablet; Take 1 tablet (300 mg total) by mouth daily.  Dispense: 30 tablet; Refill: 0 -  naltrexone (DEPADE) 50 MG tablet; Take 0.5 tablets (25 mg total) by mouth daily.  Dispense: 15 tablet; Refill: 0  2. Encounter for weight loss counseling See above medical treatment plan. - buPROPion (WELLBUTRIN XL) 300 MG 24 hr tablet; Take 1 tablet (300 mg total) by mouth daily.  Dispense: 30 tablet; Refill: 0 - naltrexone (DEPADE) 50 MG tablet; Take 0.5 tablets (25 mg total) by mouth daily.  Dispense: 15 tablet; Refill: 0    I discussed the assessment and treatment plan with the patient. The patient was provided an opportunity to ask questions and all were answered. The patient agreed with the plan and demonstrated an understanding of the instructions.   The patient was advised to call back or seek an in-person evaluation if the symptoms worsen or if the condition fails to improve as anticipated.  I provided 35 minutes of non-face-to-face time during this encounter.  I spent approximately 35 minutes with the patient today. Over 50% of this time was spent with counseling and educating the patient.    Mar Daring, PA-C  [facility]

## 2019-04-19 NOTE — Patient Instructions (Signed)
Bupropion; Naltrexone extended-release tablets What is this medicine? BUPROPION; NALTREXONE (byoo PROE pee on; nal TREX one) is a combination of two drugs that help you lose weight. This product is used with a reduced calorie diet and exercise. This product can also help you maintain weight loss. This medicine may be used for other purposes; ask your health care provider or pharmacist if you have questions. COMMON BRAND NAME(S): Contrave What should I tell my health care provider before I take this medicine? They need to know if you have any of these conditions:  an eating disorder, such as anorexia or bulimia  diabetes  depression  glaucoma  head injury  heart disease  high blood pressure  history of drug abuse or alcohol abuse problem  history of a tumor or infection of your brain or spine  history of heart attack or stroke  history of irregular heartbeat  if you often drink alcohol  kidney disease  liver disease  low levels of sodium in the blood  mental illness  seizures  suicidal thoughts, plans, or attempt; a previous suicide attempt by you or a family member  taken an MAOI like Carbex, Eldepryl, Marplan, Nardil, or Parnate in last 14 days  an unusual or allergic reaction to bupropion, naltrexone, other medicines, foods, dyes, or preservatives  breast-feeding  pregnant or trying to become pregnant How should I use this medicine? Take this medicine by mouth with a glass of water. Follow the directions on the prescription label. Do not cut, crush or chew this medicine. Swallow the tablets whole. You can take it with or without food. Do not take with high-fat meals as this may increase your risk of seizures. Take your medicine at regular intervals. Do not take it more often than directed. Do not stop taking except on your doctor's advice. A special MedGuide will be given to you by the pharmacist with each prescription and refill. Be sure to read this  information carefully each time. Talk to your pediatrician regarding the use of this medicine in children. Special care may be needed. Overdosage: If you think you have taken too much of this medicine contact a poison control center or emergency room at once. NOTE: This medicine is only for you. Do not share this medicine with others. What if I miss a dose? If you miss a dose, skip it. Take your next dose at the normal time. Do not take extra or 2 doses at the same time to make up for the missed dose. What may interact with this medicine? Do not take this medicine with any of the following medications:  any medicines used to stop taking opioids such as methadone or buprenorphine  linezolid  MAOIs like Carbex, Eldepryl, Marplan, Nardil, and Parnate  methylene blue (injected into a vein)  often take narcotic medicines for pain or cough  other medicines that contain bupropion like Zyban or Wellbutrin This medicine may also interact with the following medications:  alcohol  certain medicines for blood pressure like metoprolol, propranolol  certain medicines for depression, anxiety, or psychotic disturbances  certain medicines for HIV or hepatitis  certain medicines for irregular heart beat like propafenone, flecainide  certain medicines for Parkinson's disease like amantadine, levodopa  certain medicines for seizures like carbamazepine, phenytoin, phenobarbital  certain medicines for sleep  cimetidine  clopidogrel  cyclophosphamide  digoxin  disulfiram  furazolidone  isoniazid  nicotine  orphenadrine  procarbazine  steroid medicines like prednisone or cortisone  stimulant medicines for attention disorders,   weight loss, or to stay awake  tamoxifen  theophylline  thiotepa  ticlopidine  tramadol  warfarin This list may not describe all possible interactions. Give your health care provider a list of all the medicines, herbs, non-prescription drugs, or  dietary supplements you use. Also tell them if you smoke, drink alcohol, or use illegal drugs. Some items may interact with your medicine. What should I watch for while using this medicine? Visit your doctor or healthcare provider for regular checks on your progress. This medicine may cause serious skin reactions. They can happen weeks to months after starting the medicine. Contact your healthcare provider right away if you notice fevers or flu-like symptoms with a rash. The rash may be red or purple and then turn into blisters or peeling of the skin. Or, you might notice a red rash with swelling of the face, lips or lymph nodes in your neck or under your arms. This medicine may affect blood sugar. Ask your healthcare provider if changes in diet or medicines are needed if you have diabetes. Patients and their families should watch out for new or worsening depression or thoughts of suicide. Also watch out for sudden changes in feelings such as feeling anxious, agitated, panicky, irritable, hostile, aggressive, impulsive, severely restless, overly excited and hyperactive, or not being able to sleep. If this happens, especially at the beginning of treatment or after a change in dose, call your healthcare provider. Avoid alcoholic drinks while taking this medicine. Drinking large amounts of alcoholic beverages, using sleeping or anxiety medicines, or quickly stopping the use of these agents while taking this medicine may increase your risk for a seizure. Do not drive or use heavy machinery until you know how this medicine affects you. This medicine can impair your ability to perform these tasks. Women should inform their health care provider if they wish to become pregnant or think they might be pregnant. Losing weight while pregnant is not advised and may cause harm to the unborn child. Talk to your health care provider for more information. What side effects may I notice from receiving this medicine? Side  effects that you should report to your doctor or health care professional as soon as possible:  allergic reactions like skin rash, itching or hives, swelling of the face, lips, or tongue  breathing problems  changes in vision  confusion  elevated mood, decreased need for sleep, racing thoughts, impulsive behavior  fast or irregular heartbeat  hallucinations, loss of contact with reality  increased blood pressure  rash, fever, and swollen lymph nodes  redness, blistering, peeling, or loosening of the skin, including inside the mouth  seizures  signs and symptoms of liver injury like dark yellow or brown urine; general ill feeling or flu-like symptoms; light-colored stools; loss of appetite; nausea; right upper belly pain; unusually weak or tired; yellowing of the eyes or skin  suicidal thoughts or other mood changes  vomiting Side effects that usually do not require medical attention (report to your doctor or health care professional if they continue or are bothersome):  constipation  headache  loss of appetite  indigestion, stomach upset  tremors This list may not describe all possible side effects. Call your doctor for medical advice about side effects. You may report side effects to FDA at 1-800-FDA-1088. Where should I keep my medicine? Keep out of the reach of children. Store at room temperature between 15 and 30 degrees C (59 and 86 degrees F). Throw away any unused medicine after the   expiration date. NOTE: This sheet is a summary. It may not cover all possible information. If you have questions about this medicine, talk to your doctor, pharmacist, or health care provider.  2020 Elsevier/Gold Standard (2018-12-15 16:26:28)  

## 2019-04-23 ENCOUNTER — Other Ambulatory Visit: Payer: Self-pay

## 2019-04-23 ENCOUNTER — Ambulatory Visit (INDEPENDENT_AMBULATORY_CARE_PROVIDER_SITE_OTHER): Payer: Medicaid Other | Admitting: Internal Medicine

## 2019-04-23 ENCOUNTER — Encounter: Payer: Self-pay | Admitting: Internal Medicine

## 2019-04-23 VITALS — BP 108/70 | HR 97 | Ht 66.0 in | Wt 306.0 lb

## 2019-04-23 DIAGNOSIS — I340 Nonrheumatic mitral (valve) insufficiency: Secondary | ICD-10-CM

## 2019-04-23 DIAGNOSIS — R079 Chest pain, unspecified: Secondary | ICD-10-CM | POA: Diagnosis not present

## 2019-04-23 DIAGNOSIS — I071 Rheumatic tricuspid insufficiency: Secondary | ICD-10-CM | POA: Diagnosis not present

## 2019-04-23 NOTE — Patient Instructions (Signed)
Medication Instructions:  Your physician recommends that you continue on your current medications as directed. Please refer to the Current Medication list given to you today.  *If you need a refill on your cardiac medications before your next appointment, please call your pharmacy*  Lab Work: none If you have labs (blood work) drawn today and your tests are completely normal, you will receive your results only by: . MyChart Message (if you have MyChart) OR . A paper copy in the mail If you have any lab test that is abnormal or we need to change your treatment, we will call you to review the results.  Testing/Procedures: Your physician has requested that you have an echocardiogram. Echocardiography is a painless test that uses sound waves to create images of your heart. It provides your doctor with information about the size and shape of your heart and how well your heart's chambers and valves are working. This procedure takes approximately one hour. There are no restrictions for this procedure. You may get an IV, if needed, to receive an ultrasound enhancing agent through to better visualize your heart.   Follow-Up: At [facility], you and your health needs are our priority.  As part of our continuing mission to provide you with exceptional heart care, we have created designated Provider Care Teams.  These Care Teams include your primary Cardiologist (physician) and Advanced Practice Providers (APPs -  Physician Assistants and Nurse Practitioners) who all work together to provide you with the care you need, when you need it.  We recommend signing up for the patient portal called "MyChart".  Sign up information is provided on this After Visit Summary.  MyChart is used to connect with patients for Virtual Visits (Telemedicine).  Patients are able to view lab/test results, encounter notes, upcoming appointments, etc.  Non-urgent messages can be sent to your provider as well.   To learn more about  what you can do with MyChart, go to [email or web address].    Your next appointment:   3 month(s)  The format for your next appointment:   In Person  Provider:    You may see DR CHRISTOPHER END or one of the following Advanced Practice Providers on your designated Care Team:    Christopher Berge, NP  Ryan Dunn, PA-C Jacquelyn Visser, PA-C   Echocardiogram An echocardiogram is a procedure that uses painless sound waves (ultrasound) to produce an image of the heart. Images from an echocardiogram can provide important information about:  Signs of coronary artery disease (CAD).  Aneurysm detection. An aneurysm is a weak or damaged part of an artery wall that bulges out from the normal force of blood pumping through the body.  Heart size and shape. Changes in the size or shape of the heart can be associated with certain conditions, including heart failure, aneurysm, and CAD.  Heart muscle function.  Heart valve function.  Signs of a past heart attack.  Fluid buildup around the heart.  Thickening of the heart muscle.  A tumor or infectious growth around the heart valves. Tell a health care provider about:  Any allergies you have.  All medicines you are taking, including vitamins, herbs, eye drops, creams, and over-the-counter medicines.  Any blood disorders you have.  Any surgeries you have had.  Any medical conditions you have.  Whether you are pregnant or may be pregnant. What are the risks? Generally, this is a safe procedure. However, problems may occur, including:  Allergic reaction to dye (contrast) that may be used   during the procedure. What happens before the procedure? No specific preparation is needed. You may eat and drink normally. What happens during the procedure?   An IV tube may be inserted into one of your veins.  You may receive contrast through this tube. A contrast is an injection that improves the quality of the pictures from your heart.   A gel will be applied to your chest.  A wand-like tool (transducer) will be moved over your chest. The gel will help to transmit the sound waves from the transducer.  The sound waves will harmlessly bounce off of your heart to allow the heart images to be captured in real-time motion. The images will be recorded on a computer. The procedure may vary among health care providers and hospitals. What happens after the procedure?  You may return to your normal, everyday life, including diet, activities, and medicines, unless your health care provider tells you not to do that. Summary  An echocardiogram is a procedure that uses painless sound waves (ultrasound) to produce an image of the heart.  Images from an echocardiogram can provide important information about the size and shape of your heart, heart muscle function, heart valve function, and fluid buildup around your heart.  You do not need to do anything to prepare before this procedure. You may eat and drink normally.  After the echocardiogram is completed, you may return to your normal, everyday life, unless your health care provider tells you not to do that. This information is not intended to replace advice given to you by your health care provider. Make sure you discuss any questions you have with your health care provider. Document Revised: 06/01/2018 Document Reviewed: 03/13/2016 Elsevier Patient Education  2020 [employer]  

## 2019-04-23 NOTE — Progress Notes (Signed)
New Outpatient Visit Date: 04/23/2019  Referring Provider: Rubye Beach Street[place],  [place] [address]  Chief Complaint: "I need an echocardiogram"  HPI:  Ms. Bogen is a 29 y.o. female who is being seen today for the evaluation of chest pain and valvular heart disease at the request of Ms. Marlyn Corporal. She has a history of hypertension, mitral and tricuspid regurgitation, asthma, and ADD.  She has been evaluated in the past by Dr. Humphrey Rolls for chest pain and reportedly underwent stress testing and echocardiography.  Echos (most recently ~3-4 years ago) showed mild MR and TR as well as "stiffening of the heart" (reports are not available).  She was advised that she would need a follow-up echo in 3 years.  Ms. Armacost reports a long history of chest pain that led to her initial evaluation by Dr. Humphrey Rolls several years ago.  She continues to have frequent chest pain that extends from the front of her chest to the back and also radiates to the left shoulder.  She states that the pain is "hard to explain."  The location and quality of the pain has changed somewhat over the last few months.  Her longstanding pain is usually sharp and can happen at any time.  The pain is not exertional and only lasts a few seconds.  Ms. Legner has chronic exertional dyspnea.  She is unsure if the shortness of breath is related to her heart, asthma, or weight.  Ms. Dixson denies palpitations, orthopnea, and PND.  She has occasional orthostatic lightheadedness but has never passed out.  --------------------------------------------------------------------------------------------------  Cardiovascular History & Procedures: Cardiovascular Problems:  Chest pain  Mitral and tricuspid regurgitation  Risk Factors:  Hypertension and morbid obesity  Cath/PCI:  None  CV Surgery:  None  EP Procedures and Devices:  None  Non-Invasive Evaluation(s):  None available.  Recent CV Pertinent  Labs: Lab Results  Component Value Date   CHOL 140 06/03/2017   HDL 50 06/03/2017   LDLCALC 77 06/03/2017   TRIG 65 06/03/2017   CHOLHDL 2.8 06/03/2017   K 3.5 10/17/2017   BUN 14 10/17/2017   BUN 6 06/03/2017   CREATININE 0.93 10/17/2017    --------------------------------------------------------------------------------------------------  Past Medical History:  Diagnosis Date  . ADD (attention deficit disorder)   . Allergy   . Asthma   . Blood transfusion without reported diagnosis   . Heart murmur   . Hypertension   . Obesity     Past Surgical History:  Procedure Laterality Date  . FOOT SURGERY    . INTRAUTERINE DEVICE (IUD) INSERTION  10/2018  . INTRAUTERINE DEVICE INSERTION      Current Meds  Medication Sig  . albuterol (PROVENTIL HFA;VENTOLIN HFA) 108 (90 Base) MCG/ACT inhaler Inhale 2 puffs into the lungs every 6 (six) hours as needed for wheezing or shortness of breath.  Marland Kitchen buPROPion (WELLBUTRIN XL) 300 MG 24 hr tablet Take 1 tablet (300 mg total) by mouth daily.  Marland Kitchen levocetirizine (XYZAL) 5 MG tablet TAKE 1 TABLET(5 MG) BY MOUTH EVERY EVENING  . naltrexone (DEPADE) 50 MG tablet Take 0.5 tablets (25 mg total) by mouth daily.  Marland Kitchen omeprazole (PRILOSEC) 40 MG capsule TAKE 1 CAPSULE BY MOUTH EVERY MORNING 1 HOUR BEFORE BREAKFAST    Allergies: Patient has no known allergies.  Social History   Tobacco Use  . Smoking status: Former Smoker    Packs/day: 0.04    Years: 8.00    Pack years: 0.32  Types: Cigarettes    Quit date: 08/06/2016    Years since quitting: 2.7  . Smokeless tobacco: Never Used  Substance Use Topics  . Alcohol use: No  . Drug use: No    Family History  Problem Relation Age of Onset  . Cancer Paternal Aunt        Breast Cancer  . Asthma Mother   . Diabetes Mellitus II Sister   . Hypertension Maternal Grandfather   . Heart disease Maternal Grandfather     Review of Systems: A 12-system review of systems was performed and was  negative except as noted in the HPI.  --------------------------------------------------------------------------------------------------  Physical Exam: BP 108/70 (BP Location: Right Arm, Patient Position: Sitting, Cuff Size: Large)   Pulse 97   Ht 5\' 6"  (1.676 m)   Wt (!) 306 lb (138.8 kg)   SpO2 98%   BMI 49.39 kg/m   General:  NAD.  Accompanied by her daughter. HEENT: No conjunctival pallor or scleral icterus. Facemask in place. Neck: Supple without lymphadenopathy, thyromegaly, JVD, or HJR, though body habitus limits evaluation. No carotid bruit. Lungs: Normal work of breathing. Clear to auscultation bilaterally without wheezes or crackles. Heart: Regular rate and rhythm without murmurs, rubs, or gallops. Unable to assess PMI due to body habitus. Abd: Bowel sounds present. Soft, NT/ND.  Unable to assess HSM due to body habitus. Ext: No lower extremity edema. Radial, PT, and DP pulses are 2+ bilaterally Skin: Warm and dry without rash. Neuro: CNIII-XII intact. Strength and fine-touch sensation intact in upper and lower extremities bilaterally. Psych: Normal mood and affect.  EKG:  NSR without abnormality.  Lab Results  Component Value Date   WBC 7.8 10/17/2017   HGB 12.2 10/17/2017   HCT 33.7 (L) 10/17/2017   MCV 87.9 10/17/2017   PLT 287 10/17/2017    Lab Results  Component Value Date   NA 135 10/17/2017   K 3.5 10/17/2017   CL 106 10/17/2017   CO2 26 10/17/2017   BUN 14 10/17/2017   CREATININE 0.93 10/17/2017   GLUCOSE 103 (H) 10/17/2017   ALT 18 10/17/2017    Lab Results  Component Value Date   CHOL 140 06/03/2017   HDL 50 06/03/2017   LDLCALC 77 06/03/2017   TRIG 65 06/03/2017   CHOLHDL 2.8 06/03/2017     --------------------------------------------------------------------------------------------------  ASSESSMENT AND PLAN: Mitral and tricuspid regurgitation: Exam today is unrevealing, though Ms. Osterlund reports chronic chest pain and dyspnea on  exertion with prior echos showing mild mitral and tricuspid regurgitation.  Given that symptoms have progressed recently, I think it is reasonable to repeat an echocardiogram.  Chest pain: This has been longstanding though quality seems to have changed a little over the last few months.  EKG and exam today are unrevealing.  Additionally, prior stress testing a few years ago was reportedly unremarkable.  I have a low suspicion for significant ASCVD given the patient's young age, though her morbid obesity and history of hypertension place her at some risk.  We will begin an echocardiogram, as above.  I have recommended that she try an OTC NSAID as needed for pain to see if it provides relief for a possible component of MSK pain.  Morbid obesity: This is likely contributing to her exertional dyspnea and also to chest wall pain.  I encouraged Ms. Fangman to lose weight though diet and exercise.  She brought up the possibility of breast reduction surgery.  I encouraged her to discuss this and possible referral  to plastic surgery with her PCP.  Follow-up: Return to clinic in 3 months.  Yvonne Kendall, MD 04/24/2019 2:52 PM

## 2019-04-24 ENCOUNTER — Encounter: Payer: Self-pay | Admitting: Internal Medicine

## 2019-04-24 DIAGNOSIS — I340 Nonrheumatic mitral (valve) insufficiency: Secondary | ICD-10-CM | POA: Insufficient documentation

## 2019-04-24 DIAGNOSIS — R079 Chest pain, unspecified: Secondary | ICD-10-CM | POA: Insufficient documentation

## 2019-05-15 ENCOUNTER — Other Ambulatory Visit: Payer: Medicaid Other

## 2019-05-18 ENCOUNTER — Other Ambulatory Visit: Payer: Self-pay | Admitting: Physician Assistant

## 2019-05-18 DIAGNOSIS — Z713 Dietary counseling and surveillance: Secondary | ICD-10-CM

## 2019-05-18 DIAGNOSIS — K219 Gastro-esophageal reflux disease without esophagitis: Secondary | ICD-10-CM

## 2019-05-18 NOTE — Telephone Encounter (Signed)
Requested medication (s) are due for refill today: yes  Requested medication (s) are on the active medication list: yes  Last refill:  04/19/19 #15  Future visit scheduled: yes  Notes to clinic:  Refill not delegated per protocol    Requested Prescriptions  Pending Prescriptions Disp Refills   naltrexone (DEPADE) 50 MG tablet [Pharmacy Med Name: NALTREXONE 50MG  TABLETS] 15 tablet 0    Sig: TAKE 1/2 TABLET(25 MG) BY MOUTH DAILY      Not Delegated - Psychiatry: Drug Dependence Therapy - naltrexone Failed - 05/18/2019  4:37 PM      Failed - This refill cannot be delegated      Failed - ALT in normal range and within 360 days    ALT  Date Value Ref Range Status  10/17/2017 18 0 - 44 U/L Final          Failed - AST in normal range and within 360 days    AST  Date Value Ref Range Status  10/17/2017 15 15 - 41 U/L Final          Passed - Valid encounter within last 12 months    Recent Outpatient Visits           4 weeks ago Class 3 severe obesity due to excess calories with serious comorbidity and body mass index (BMI) of 45.0 to 49.9 in adult [facility])   [facility] [place], [place], [place]   3 months ago Acute swimmer's ear of right side   [facility] [place], [place], PA-C   4 months ago Class 3 severe obesity due to excess calories with serious comorbidity and body mass index (BMI) of 50.0 to 59.9 in adult [facility])   [facility] [place], [place], PA-C   6 months ago Encounter for management of intrauterine contraceptive device (IUD), unspecified IUD management type   [facility] [place], Dionne Bucy, MD   8 months ago [place], Wendee [place], [place]       Future Appointments             In 3 days [place], Clearnce Sorrel, PA-C [employer], [place]   In 1 month End, Harrell Gave, MD [place], [facility]             Signed Prescriptions Disp  Refills   buPROPion (WELLBUTRIN XL) 300 MG 24 hr tablet 30 tablet 0    Sig: TAKE 1 TABLET(300 MG) BY MOUTH DAILY      Psychiatry: Antidepressants - bupropion Passed - 05/18/2019  4:37 PM      Passed - Last BP in normal range    BP Readings from Last 1 Encounters:  04/23/19 108/70          Passed - Valid encounter within last 6 months    Recent Outpatient Visits           4 weeks ago Class 3 severe obesity due to excess calories with serious comorbidity and body mass index (BMI) of 45.0 to 49.9 in adult [facility])   [place], [place]   3 months ago Acute swimmer's ear of right side   [place], [place], PA-C   4 months ago Class 3 severe obesity due to excess calories with serious comorbidity and body mass index (BMI) of 50.0 to 59.9 in adult [facility])   [place], [place], [place]   6 months ago Encounter  for management of intrauterine contraceptive device (IUD), unspecified IUD management type   [facility] [place], Marzella Schlein, MD   8 months ago Cystitis   [facility] [place], [place]       Future Appointments             In 3 days Rosezetta Schlatter, Alessandra Bevels, PA-C [facility] [employer] [facility], [facility]   In 1 month End, Cristal Deer, MD [facility] [place], [facility]              omeprazole (PRILOSEC) 40 MG capsule 90 capsule 1    Sig: TAKE 1 CAPSULE BY MOUTH EVERY MORNING 1 HOUR BEFORE BREAKFAST      Gastroenterology: Proton Pump Inhibitors Passed - 05/18/2019  4:37 PM      Passed - Valid encounter within last 12 months    Recent Outpatient Visits           4 weeks ago Class 3 severe obesity due to excess calories with serious comorbidity and body mass index (BMI) of 45.0 to 49.9 in adult [facility])   [facility] [place], [place], [place]   3 months ago Acute swimmer's ear of right side   [facility] [place], [place], PA-C    4 months ago Class 3 severe obesity due to excess calories with serious comorbidity and body mass index (BMI) of 50.0 to 59.9 in adult [facility])   [facility] [place], Alessandra Bevels, PA-C   6 months ago Encounter for management of intrauterine contraceptive device (IUD), unspecified IUD management type   [facility] [place], Marzella Schlein, MD   8 months ago Cystitis   [facility] [place], [place]       Future Appointments             In 3 days Burnette, Alessandra Bevels, PA-C [facility] [employer] [facility], [facility]   In 1 month End, Cristal Deer, MD [facility], [facility]

## 2019-05-20 NOTE — Telephone Encounter (Signed)
LOV  02/08/19 Acute   LRF  04/19/19  #15x 0

## 2019-05-21 ENCOUNTER — Telehealth (INDEPENDENT_AMBULATORY_CARE_PROVIDER_SITE_OTHER): Payer: Medicaid Other | Admitting: Physician Assistant

## 2019-05-21 ENCOUNTER — Encounter: Payer: Self-pay | Admitting: Physician Assistant

## 2019-05-21 VITALS — Wt 302.0 lb

## 2019-05-21 DIAGNOSIS — Z6841 Body Mass Index (BMI) 40.0 and over, adult: Secondary | ICD-10-CM

## 2019-05-21 DIAGNOSIS — Z713 Dietary counseling and surveillance: Secondary | ICD-10-CM | POA: Diagnosis not present

## 2019-05-21 NOTE — Progress Notes (Signed)
Patient: Amanda [name] Female    DOB: 05-22-1990   29 y.o.   MRN: [id number] Visit Date: 05/21/2019  Today's Provider: Mar Daring, PA-C   Chief Complaint  Patient presents with  . Obesity   Subjective:    Virtual Visit via Video Note  I connected with Amanda [name] on 05/21/19 at  3:40 PM EDT by a video enabled telemedicine application and verified that I am speaking with the correct person using two identifiers.  Location: Patient: Home Provider: [facility]   I discussed the limitations of evaluation and management by telemedicine and the availability of in person appointments. The patient expressed understanding and agreed to proceed.  HPI  Patient needs refill on the Naltrexone and wellbutrin. She is doing herbal life shakes.She is doing Cardio 3x per week. She does mention that she does not do the herbal life shakes daily because she forgets.   Wt Readings from Last 3 Encounters:  05/21/19 (!) 302 lb (137 kg)  04/23/19 (!) 306 lb (138.8 kg)  02/08/19 299 lb 9.6 oz (135.9 kg)    No Known Allergies   Current Outpatient Medications:  .  buPROPion (WELLBUTRIN XL) 300 MG 24 hr tablet, TAKE 1 TABLET(300 MG) BY MOUTH DAILY, Disp: 30 tablet, Rfl: 0 .  levocetirizine (XYZAL) 5 MG tablet, TAKE 1 TABLET(5 MG) BY MOUTH EVERY EVENING, Disp: 90 tablet, Rfl: 1 .  naltrexone (DEPADE) 50 MG tablet, TAKE 1/2 TABLET(25 MG) BY MOUTH DAILY, Disp: 90 tablet, Rfl: 0 .  omeprazole (PRILOSEC) 40 MG capsule, TAKE 1 CAPSULE BY MOUTH EVERY MORNING 1 HOUR BEFORE BREAKFAST, Disp: 90 capsule, Rfl: 1 .  albuterol (PROVENTIL HFA;VENTOLIN HFA) 108 (90 Base) MCG/ACT inhaler, Inhale 2 puffs into the lungs every 6 (six) hours as needed for wheezing or shortness of breath. (Patient not taking: Reported on 05/21/2019), Disp: 1 Inhaler, Rfl: 0 .  levonorgestrel (MIRENA) 20 MCG/24HR IUD, 1 Intra Uterine Device (1 each total) by Intrauterine route once for 1 dose., Disp: 1 each, Rfl: 0  Review of  Systems  Constitutional: Negative.   Respiratory: Negative.   Cardiovascular: Negative.   Gastrointestinal: Negative.   Neurological: Negative.     Social History   Tobacco Use  . Smoking status: Former Smoker    Packs/day: 0.04    Years: 8.00    Pack years: 0.32    Types: Cigarettes    Quit date: 08/06/2016    Years since quitting: 2.7  . Smokeless tobacco: Never Used  Substance Use Topics  . Alcohol use: No      Objective:   Wt (!) 302 lb (137 kg)   BMI 48.74 kg/m  Vitals:   05/21/19 1514  Weight: (!) 302 lb (137 kg)  Body mass index is 48.74 kg/m.   Physical Exam Vitals reviewed.  Constitutional:      General: She is not in acute distress. Pulmonary:     [place]: Pulmonary effort is normal. No respiratory distress.  Neurological:     Mental Status: She is alert.  Psychiatric:        Mood and Affect: Mood normal.        Behavior: Behavior normal.        Thought Content: Thought content normal.        Judgment: Judgment normal.     No results found for any visits on 05/21/19.     Assessment & Plan     1. Encounter for weight loss counseling Will  continue Wellbutrin and Naltrexone as prescribed as she is tolerating well and is having benefits of increased energy and craving control. These were refilled earlier today. Return in 3-6 months.   2. Class 3 severe obesity due to excess calories with serious comorbidity and body mass index (BMI) of 45.0 to 49.9 in adult [facility]) See above medical treatment plan.   I discussed the assessment and treatment plan with the patient. The patient was provided an opportunity to ask questions and all were answered. The patient agreed with the plan and demonstrated an understanding of the instructions.   The patient was advised to call back or seek an in-person evaluation if the symptoms worsen or if the condition fails to improve as anticipated.  I provided 21 minutes of non-face-to-face time during this encounter.     Margaretann Loveless, PA-C  [facility]

## 2019-06-15 ENCOUNTER — Other Ambulatory Visit: Payer: Self-pay | Admitting: Physician Assistant

## 2019-06-15 DIAGNOSIS — Z713 Dietary counseling and surveillance: Secondary | ICD-10-CM

## 2019-06-15 NOTE — Telephone Encounter (Signed)
Patient instructed to return 9/21 per last visit- RF granted

## 2019-06-21 ENCOUNTER — Other Ambulatory Visit: Payer: Medicaid Other

## 2019-06-27 ENCOUNTER — Other Ambulatory Visit: Payer: Self-pay | Admitting: Physician Assistant

## 2019-06-27 DIAGNOSIS — J301 Allergic rhinitis due to pollen: Secondary | ICD-10-CM

## 2019-07-04 ENCOUNTER — Telehealth: Payer: Self-pay

## 2019-07-04 ENCOUNTER — Encounter: Payer: Self-pay | Admitting: Physician Assistant

## 2019-07-04 NOTE — Telephone Encounter (Signed)
Copied from CRM [id number]. Topic: General - Other >> Jul 04, 2019  4:35 PM [name] wrote: Patient would like a callback from [facility] nurse in regards to a medicaid change.

## 2019-07-05 ENCOUNTER — Encounter: Payer: Self-pay | Admitting: Physician Assistant

## 2019-07-05 DIAGNOSIS — L600 Ingrowing nail: Secondary | ICD-10-CM

## 2019-07-05 NOTE — Telephone Encounter (Signed)
Patient is checking status on when she can get a call back from office regarding medicaid change.  Patient wants to speak to office.   Call back [phone]

## 2019-07-05 NOTE — Telephone Encounter (Signed)
Spoke to patient about this. cbe

## 2019-07-05 NOTE — Telephone Encounter (Signed)
Ive already spoken to [place] about the Medicaid changes.      However, She does want a referral back to [facility] for a toe issue that she seen them for in 2020. She thinks it was Dr. Logan Bores. And wants this ASAP

## 2019-07-06 ENCOUNTER — Ambulatory Visit (INDEPENDENT_AMBULATORY_CARE_PROVIDER_SITE_OTHER): Payer: Medicaid Other | Admitting: Podiatry

## 2019-07-06 ENCOUNTER — Encounter: Payer: Self-pay | Admitting: Podiatry

## 2019-07-06 ENCOUNTER — Other Ambulatory Visit: Payer: Self-pay

## 2019-07-06 ENCOUNTER — Ambulatory Visit: Payer: Medicaid Other | Admitting: Internal Medicine

## 2019-07-06 DIAGNOSIS — L600 Ingrowing nail: Secondary | ICD-10-CM | POA: Diagnosis not present

## 2019-07-06 MED ORDER — GENTAMICIN SULFATE 0.1 % EX CREA: 1.0000 "application " | TOPICAL_CREAM | Freq: Two times a day (BID) | CUTANEOUS | 1 refills | Status: DC

## 2019-07-06 MED ORDER — DOXYCYCLINE HYCLATE 100 MG PO TABS: 100.0000 mg | ORAL_TABLET | Freq: Two times a day (BID) | ORAL | 0 refills | Status: DC

## 2019-07-09 NOTE — Progress Notes (Signed)
   Subjective: 29 y.o. female presenting today with a chief complaint of soreness to the lateral border of the left great toe that began 3-4 days ago. She reports associated drainage, redness and swelling of the area. Touching the toe increases the pain. She has been soaking the toe in Epsom salt and applying triple antibiotic ointment for treatment. Patient is here for further evaluation and treatment.   Past Medical History:  Diagnosis Date  . ADD (attention deficit disorder)   . Allergy   . Asthma   . Blood transfusion without reported diagnosis   . Heart murmur   . Hypertension   . Obesity     Objective:  General: Well developed, nourished, in no acute distress, alert and oriented x3   Dermatology: Skin is warm, dry and supple bilateral. Lateral border of the left great toe appears to be erythematous with evidence of an ingrowing nail. Pain on palpation noted to the border of the nail fold. The remaining nails appear unremarkable at this time. There are no open sores, lesions.  Vascular: Dorsalis Pedis artery and Posterior Tibial artery pedal pulses palpable. No lower extremity edema noted.   Neruologic: Grossly intact via light touch bilateral.  Musculoskeletal: Muscular strength within normal limits in all groups bilateral. Normal range of motion noted to all pedal and ankle joints.   Assessment:  #1 Paronychia with ingrowing nail lateral border left hallux  #2 Pain in toe #3 Incurvated nail  Plan of Care:  1. Patient evaluated.  2. Discussed treatment alternatives and plan of care. Explained nail avulsion procedure and post procedure course to patient. 3. Patient opted for total temporary nail avulsion of the of the left great toenail.  4. Prior to procedure, local anesthesia infiltration utilized using 3 ml of a 50:50 mixture of 2% plain lidocaine and 0.5% plain marcaine in a normal hallux block fashion and a betadine prep performed.  5. Light dressing applied. 6.  Prescription for Doxycycline 100 mg provided to patient.  7. Prescription for Gentamicin cream provided to patient to use daily with a bandage.  8. Return to clinic in 2 weeks.   Felecia Shelling, DPM [facility]  Dr. Felecia Shelling, DPM    [address]                                        [place], [place] [address]                Office [phone]  Fax [phone]

## 2019-07-12 ENCOUNTER — Telehealth: Payer: Self-pay

## 2019-07-12 NOTE — Telephone Encounter (Signed)
Patient called left message stating that the Doxycycline is causing a lot of nausea/vomiting even if she takes with foot or not.  She said she was able to get 5 days of medication in.  I instructed her to stop the doxycycline, continue using the antibiotic cream and I will let her know if you want to give a different antibiotic or not.  Please advise

## 2019-07-13 MED ORDER — CEPHALEXIN 500 MG PO CAPS: 500.0000 mg | ORAL_CAPSULE | Freq: Three times a day (TID) | ORAL | 0 refills | Status: DC

## 2019-07-13 NOTE — Addendum Note (Signed)
Addended by: Geraldine Contras D on: 07/13/2019 09:25 AM   Modules accepted: Orders

## 2019-07-13 NOTE — Telephone Encounter (Signed)
Ok give her Keflex 500mg  tid x 10 days

## 2019-07-13 NOTE — Telephone Encounter (Signed)
Informed patient via voice mail regarding new antibiotic per Dr. Logan Bores.  Keflex has been sent to pharmacy.

## 2019-07-20 ENCOUNTER — Ambulatory Visit: Payer: Medicaid Other | Admitting: Podiatry

## 2019-07-26 ENCOUNTER — Other Ambulatory Visit: Payer: Medicaid Other

## 2019-07-27 ENCOUNTER — Telehealth: Payer: Self-pay

## 2019-07-27 NOTE — Telephone Encounter (Signed)
Attempted to schedule.  LMOV to call office.    Needs echo and ov

## 2019-07-30 ENCOUNTER — Ambulatory Visit: Payer: Medicaid Other | Admitting: Family

## 2019-08-10 ENCOUNTER — Telehealth: Payer: Self-pay | Admitting: *Deleted

## 2019-08-10 ENCOUNTER — Other Ambulatory Visit: Payer: Self-pay

## 2019-08-10 ENCOUNTER — Other Ambulatory Visit (HOSPITAL_COMMUNITY)
Admission: RE | Admit: 2019-08-10 | Discharge: 2019-08-10 | Disposition: A | Discharge status: Home / Self Care | Payer: Medicaid Other | Source: Ambulatory Visit | Attending: Physician Assistant | Admitting: Physician Assistant

## 2019-08-10 ENCOUNTER — Encounter: Payer: Self-pay | Admitting: Physician Assistant

## 2019-08-10 ENCOUNTER — Ambulatory Visit (INDEPENDENT_AMBULATORY_CARE_PROVIDER_SITE_OTHER): Payer: Medicaid Other | Admitting: Physician Assistant

## 2019-08-10 VITALS — BP 105/70 | HR 87 | Temp 97.2°F | Resp 16 | Wt 303.0 lb

## 2019-08-10 DIAGNOSIS — Z202 Contact with and (suspected) exposure to infections with a predominantly sexual mode of transmission: Secondary | ICD-10-CM | POA: Diagnosis not present

## 2019-08-10 MED ORDER — CEFTRIAXONE SODIUM 500 MG IJ SOLR: 500.0000 mg | Freq: Once | INTRAMUSCULAR | 0 refills | Status: DC

## 2019-08-10 MED ORDER — AZITHROMYCIN 500 MG PO TABS: 1000.0000 mg | ORAL_TABLET | Freq: Once | ORAL | 0 refills | Status: AC

## 2019-08-10 MED ORDER — CEFTRIAXONE SODIUM 500 MG IJ SOLR
500.0000 mg | Freq: Once | INTRAMUSCULAR | Status: AC
  Administered 2019-08-10: 500 mg via INTRAMUSCULAR

## 2019-08-10 NOTE — Telephone Encounter (Signed)
FYI... Pt has apt at 3:40pm today.   Thanks,   -Vernona Rieger

## 2019-08-10 NOTE — Progress Notes (Signed)
Established patient visit   Patient: Amanda [name]   DOB: 1990-08-28   29 y.o. Female  MRN: [id number] Visit Date: 08/10/2019  Today's healthcare provider: Margaretann Loveless, PA-C   Chief Complaint  Patient presents with  . Exposure to STD   Subjective    HPI Sexually Transmitted Disease Check: Patient presents for sexually transmitted disease check. Sexual history reviewed with the patient. STD exposure: denies knowledge of risky exposure.  Previous history of STD:  chlamydia, trichomonas. Current symptoms include vaginal irritation: she is having some type of cramping in her bladder.  Patient Active Problem List   Diagnosis Date Noted  . Mitral valve insufficiency 04/24/2019  . Chest pain of uncertain etiology 04/24/2019  . Vitamin D deficiency 04/28/2016  . Laryngopharyngeal reflux 03/18/2016  . 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 Fatigue 12/31/2013  . Morbid obesity (HCC) 07/28/2012  . Tobacco use disorder 07/28/2012   Past Medical History:  Diagnosis Date  . ADD (attention deficit disorder)   . Allergy   . Asthma   . Blood transfusion without reported diagnosis   . Heart murmur   . Hypertension   . Obesity        Medications: Outpatient Medications Prior to Visit  Medication Sig  . buPROPion (WELLBUTRIN XL) 300 MG 24 hr tablet TAKE 1 TABLET(300 MG) BY MOUTH DAILY  . levocetirizine (XYZAL) 5 MG tablet TAKE 1 TABLET(5 MG) BY MOUTH EVERY EVENING  . naltrexone (DEPADE) 50 MG tablet TAKE 1/2 TABLET(25 MG) BY MOUTH DAILY  . omeprazole (PRILOSEC) 40 MG capsule TAKE 1 CAPSULE BY MOUTH EVERY MORNING 1 HOUR BEFORE BREAKFAST  . cephALEXin (KEFLEX) 500 MG capsule Take 1 capsule (500 mg total) by mouth 3 (three) times daily.  Marland Kitchen doxycycline (VIBRA-TABS) 100 MG tablet Take 1 tablet (100 mg total) by mouth 2 (two) times daily.  Marland Kitchen gentamicin cream (GARAMYCIN) 0.1 % Apply 1 application topically 2 (two) times daily. (Patient not taking: Reported on 08/10/2019)  .  levonorgestrel (MIRENA) 20 MCG/24HR IUD 1 Intra Uterine Device (1 each total) by Intrauterine route once for 1 dose.   No facility-administered medications prior to visit.    Review of Systems  Constitutional: Negative.   Respiratory: Negative.   Cardiovascular: Negative.   Genitourinary: Positive for dysuria and vaginal discharge. Negative for flank pain.       Objective    BP 105/70 (BP Location: Left Arm, Patient Position: Sitting, Cuff Size: Normal)   Pulse 87   Temp (!) 97.2 F (36.2 C) (Temporal)   Resp 16   Wt (!) 303 lb (137.4 kg)   BMI 48.91 kg/m  BP Readings from Last 3 Encounters:  08/10/19 105/70  04/23/19 108/70  12/18/18 110/80   Wt Readings from Last 3 Encounters:  08/10/19 (!) 303 lb (137.4 kg)  05/21/19 (!) 302 lb (137 kg)  04/23/19 (!) 306 lb (138.8 kg)      Physical Exam Vitals reviewed.  Constitutional:      Appearance: Normal appearance. She is well-developed. She is obese.  HENT:     Head: Normocephalic and atraumatic.  Pulmonary:     [place]: Pulmonary effort is normal. No respiratory distress.  Musculoskeletal:     Cervical back: Normal range of motion and neck supple.  Neurological:     Mental Status: She is alert.  Psychiatric:        Mood and Affect: Mood normal.        Behavior: Behavior normal.  Thought Content: Thought content normal.        Judgment: Judgment normal.      No results found for any visits on 08/10/19.  Assessment & Plan     1. Exposure to gonorrhea Swab collected by patient for testing. Will treat prophylactically with rocephin IM in office and will cover for chalmydia with azithromycin as below. Will f/u pending results.  - azithromycin (ZITHROMAX) 500 MG tablet; Take 2 tablets (1,000 mg total) by mouth once for 1 dose.  Dispense: 2 tablet; Refill: 0 - Cervicovaginal ancillary only - cefTRIAXone (ROCEPHIN) injection 500 mg   No follow-ups on file.      Reynolds Bowl, PA-C, have reviewed  all documentation for this visit. The documentation on 08/12/19 for the exam, diagnosis, procedures, and orders are all accurate and complete.   Rubye Beach  [facility] [phone] (phone) [phone] (fax)  [place]

## 2019-08-10 NOTE — Patient Instructions (Signed)
Gonorrhea Gonorrhea is a sexually transmitted disease (STD) that can affect both men and women. If left untreated, this infection can:  Damage the female or female organs.  Cause women and men to be unable to have children (be sterile).  Harm a fetus if an infected woman is pregnant. It is important to get treatment for gonorrhea as soon as possible. It is also necessary for all of your sexual partners to be tested for the infection. What are the causes? This condition is caused by bacteria called Neisseria gonorrhoeae. The infection is spread from person to person through sexual contact, including oral, anal, and vaginal sex. A newborn can contract the infection from his or her mother during birth. What increases the risk? The following factors may make you more likely to develop this condition:  Being a woman who is younger than 31 years of age and who is sexually active.  Being a woman 25 years of age or older who has: ? A new sex partner. ? More than one sex partner. ? A sex partner who has an STD.  Being a man who has: ? A new sex partner. ? More than one sex partner. ? A sex partner who has an STD.  Using condoms inconsistently.  Currently having, or having previously had, an STD.  Exchanging sex for money or drugs. What are the signs or symptoms? Some people do not have any symptoms. If you do have symptoms, they may be different for females and males. For females  Pain in the lower abdomen.  Abnormal vaginal discharge. The discharge may be cloudy, thick, or yellow-green in color.  Bleeding between periods.  Painful sex.  Burning or itching in and around the vagina.  Pain or burning when urinating.  Irritation, pain, bleeding, or discharge from the rectum. This may occur if the infection was spread by anal sex.  Sore throat or swollen lymph nodes in the neck. This may occur if the infection was spread by oral sex. For males  Abnormal discharge from the penis.  This discharge may be cloudy, thick, or yellow-green in color.  Pain or burning during urination.  Pain or swelling in the testicles.  Irritation, pain, bleeding, or discharge from the rectum. This may occur if the infection was spread by anal sex.  Sore throat, fever, or swollen lymph nodes in the neck. This may occur if the infection was spread by oral sex. How is this diagnosed? This condition is diagnosed based on:  A physical exam.  A sample of discharge that is examined under a microscope to look for the bacteria. The discharge may be taken from the urethra, cervix, throat, or rectum.  Urine tests. Not all of test results will be available during your visit. How is this treated? This condition is treated with antibiotic medicines. It is important for treatment to begin as soon as possible. Early treatment may prevent some problems from developing. Do not have sex during treatment. Avoid all types of sexual activity for 7 days after treatment is complete and until any sex partners have been treated. Follow these instructions at home:  Take over-the-counter and prescription medicines only as told by your health care provider.  Take your antibiotic medicine as told by your health care provider. Do not stop taking the antibiotic even if you start to feel better.  Do not have sex until at least 7 days after you and your partner(s) have finished treatment and your health care provider says it is okay.    It is your responsibility to get your test results. Ask your health care provider, or the department performing the test, when your results will be ready.  If you test positive for gonorrhea, inform your recent sexual partners. This includes any oral, anal, or vaginal sex partners. They need to be checked for gonorrhea even if they do not have symptoms. They may need treatment, even if they test negative for gonorrhea.  Keep all follow-up visits as told by your health care provider.  This is important. How is this prevented?   Use latex condoms correctly every time you have sexual intercourse.  Ask if your sexual partner has been tested for STDs and had negative results.  Avoid having multiple sexual partners. Contact a health care provider if:  You develop a bad reaction to the medicine you were prescribed. This may include: ? A rash. ? Nausea. ? Vomiting. ? Diarrhea.  Your symptoms do not get better after a few days of taking antibiotics.  Your symptoms get worse.  You develop new symptoms.  Your pain gets worse.  You have a fever.  You develop pain, itching, or discharge around the eyes. Get help right away if:  You feel dizzy or faint.  You have trouble breathing or have shortness of breath.  You develop an irregular heartbeat.  You have severe abdominal pain with or without shoulder pain.  You develop any bumps or sores (lesions) on your skin.  You develop warmth, redness, pain, or swelling around your joints, such as the knee. Summary  Gonorrhea is an STD that can affect both men and women.  This condition is caused by bacteria called Neisseria gonorrhoeae. The infection is spread from person to person, usually through sexual contact, including oral, anal, and vaginal sex.  Symptoms vary between males and females. Generally, they include abnormal discharge and burning during urination. Women may also experience painful sex, itching around the vagina, and bleeding between menstrual periods. Men may also experience swelling of the testicles.  This condition is treated with antibiotic medicines. Do not have sex until at least 7 days after completing antibiotic treatment.  If left untreated, gonorrhea can have serious side effects and complications. This information is not intended to replace advice given to you by your health care provider. Make sure you discuss any questions you have with your health care provider. Document Revised:  07/04/2018 Document Reviewed: 01/09/2016 Elsevier Patient Education  2020 [employer]  

## 2019-08-10 NOTE — Telephone Encounter (Signed)
Copied from CRM [id number]. Topic: General - Other >> Aug 10, 2019 11:28 AM Amanda [name] A wrote: Reason for CRM: Patient called to inform Amanda [name] that she has been feeling pain in her bladder area states that she know it is Gonorrhea but only started symptoms 3 days ago. Patient would like a call back please with some instructions since this is an urgent matter. Please advise Ph# [phone]

## 2019-08-13 ENCOUNTER — Encounter: Payer: Self-pay | Admitting: Physician Assistant

## 2019-08-14 LAB — CERVICOVAGINAL ANCILLARY ONLY
Bacterial Vaginitis (gardnerella): NEGATIVE
Candida Glabrata: NEGATIVE
Candida Vaginitis: NEGATIVE
Chlamydia: NEGATIVE
Comment: NEGATIVE
Comment: NEGATIVE
Comment: NEGATIVE
Comment: NEGATIVE
Comment: NEGATIVE
Comment: NORMAL
Neisseria Gonorrhea: NEGATIVE
Trichomonas: NEGATIVE

## 2019-08-21 ENCOUNTER — Telehealth: Payer: Self-pay

## 2019-08-21 NOTE — Telephone Encounter (Signed)
-----   Message from Margaretann Loveless, PA-C sent at 08/21/2019 10:59 AM EDT ----- Vaginal swab tested completely negative.

## 2019-08-21 NOTE — Telephone Encounter (Signed)
Written by Margaretann Loveless, PA-C on 08/21/2019 10:59 AM EDT Seen by patient Amanda [name] on 08/21/2019 11:04 AM

## 2019-09-11 ENCOUNTER — Other Ambulatory Visit: Payer: Medicaid Other

## 2019-09-17 ENCOUNTER — Ambulatory Visit: Payer: Medicaid Other | Admitting: Family

## 2019-09-18 ENCOUNTER — Encounter: Payer: Self-pay | Admitting: Physician Assistant

## 2019-09-18 DIAGNOSIS — F419 Anxiety disorder, unspecified: Secondary | ICD-10-CM

## 2019-09-24 ENCOUNTER — Telehealth (INDEPENDENT_AMBULATORY_CARE_PROVIDER_SITE_OTHER): Payer: Medicaid Other | Admitting: Physician Assistant

## 2019-09-24 ENCOUNTER — Encounter: Payer: Self-pay | Admitting: Physician Assistant

## 2019-09-24 DIAGNOSIS — U071 COVID-19: Secondary | ICD-10-CM

## 2019-09-24 DIAGNOSIS — F320 Major depressive disorder, single episode, mild: Secondary | ICD-10-CM | POA: Diagnosis not present

## 2019-09-24 MED ORDER — BENZONATATE 200 MG PO CAPS: 200.0000 mg | ORAL_CAPSULE | Freq: Three times a day (TID) | ORAL | 0 refills | Status: DC | PRN

## 2019-09-24 MED ORDER — ESCITALOPRAM OXALATE 10 MG PO TABS: 10.0000 mg | ORAL_TABLET | Freq: Every day | ORAL | 1 refills | Status: DC

## 2019-09-24 NOTE — Progress Notes (Signed)
MyChart Video Visit    Virtual Visit via Video Note   This visit type was conducted due to national recommendations for restrictions regarding the COVID-19 Pandemic (e.g. social distancing) in an effort to limit this patient's exposure and mitigate transmission in our community. This patient is at least at moderate risk for complications without adequate follow up. This format is felt to be most appropriate for this patient at this time. Physical exam was limited by quality of the video and audio technology used for the visit.   I connected with Amanda [name] on 09/24/2019 at  4:40 PM EDT by a video enabled telemedicine application and verified that I am speaking with the correct person using two identifiers.  I discussed the limitations of evaluation and management by telemedicine and the availability of in person appointments. The patient expressed understanding and agreed to proceed.  Patient location: Home Provider location: [facility]   Patient: Amanda [name]   DOB: 21-Oct-1990   29 y.o. Female  MRN: [id number] Visit Date: 09/24/2019  Today's healthcare provider: Margaretann Loveless, PA-C   No chief complaint on file.  Subjective    HPI  [name] presents today via mychart video visit for positive covid 19. Symptoms started approx 4 days ago with fevers, myalgias, and cough. She does have history of asthma and is using her inhaler approc 3 times daily at this time. She was tested yesterday and got her results this morning that she and her daughter were positive for covid 19. Currently she denies any abnormal SOB or difficulty breathing.  Patient Active Problem List   Diagnosis Date Noted  . Mitral valve insufficiency 04/24/2019  . Chest pain of uncertain etiology 04/24/2019  . Vitamin D deficiency 04/28/2016  . Laryngopharyngeal reflux 03/18/2016  . 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 Fatigue 12/31/2013  . Morbid obesity (HCC) 07/28/2012  . Tobacco use disorder 07/28/2012    Past Medical History:  Diagnosis Date  . ADD (attention deficit disorder)   . Allergy   . Asthma   . Blood transfusion without reported diagnosis   . Heart murmur   . Hypertension   . Obesity       Medications: Outpatient Medications Prior to Visit  Medication Sig  . buPROPion (WELLBUTRIN XL) 300 MG 24 hr tablet TAKE 1 TABLET(300 MG) BY MOUTH DAILY  . gentamicin cream (GARAMYCIN) 0.1 % Apply 1 application topically 2 (two) times daily. (Patient not taking: Reported on 08/10/2019)  . levocetirizine (XYZAL) 5 MG tablet TAKE 1 TABLET(5 MG) BY MOUTH EVERY EVENING  . levonorgestrel (MIRENA) 20 MCG/24HR IUD 1 Intra Uterine Device (1 each total) by Intrauterine route once for 1 dose.  . naltrexone (DEPADE) 50 MG tablet TAKE 1/2 TABLET(25 MG) BY MOUTH DAILY  . omeprazole (PRILOSEC) 40 MG capsule TAKE 1 CAPSULE BY MOUTH EVERY MORNING 1 HOUR BEFORE BREAKFAST   No facility-administered medications prior to visit.    Review of Systems  Constitutional: Positive for appetite change, chills, fatigue and fever.  HENT: Positive for congestion, postnasal drip, sinus pain and sore throat.   Respiratory: Positive for cough. Negative for chest tightness, shortness of breath and wheezing.   Cardiovascular: Negative for chest pain, palpitations and leg swelling.  Gastrointestinal: Positive for nausea. Negative for abdominal pain.  Neurological: Positive for headaches.    Last CBC Lab Results  Component Value Date   WBC 3.7 (L) 09/29/2019   HGB 12.7 09/29/2019   HCT 38.4 09/29/2019   MCV 90.4 09/29/2019  MCH 29.9 09/29/2019   RDW 13.1 09/29/2019   PLT 202 09/29/2019   Last metabolic panel Lab Results  Component Value Date   GLUCOSE 100 (H) 09/29/2019   NA 136 09/29/2019   K 3.4 (L) 09/29/2019   CL 107 09/29/2019   CO2 24 09/29/2019   BUN 9 09/29/2019   CREATININE 0.82 09/29/2019   GFRNONAA >60 09/29/2019   GFRAA >60 09/29/2019   CALCIUM 8.2 (L) 09/29/2019   PROT 7.0 10/17/2017    ALBUMIN 3.6 10/17/2017   LABGLOB 3.0 06/03/2017   AGRATIO 1.3 06/03/2017   BILITOT 0.8 10/17/2017   ALKPHOS 46 10/17/2017   AST 15 10/17/2017   ALT 18 10/17/2017   ANIONGAP 5 09/29/2019      Objective    There were no vitals taken for this visit. BP Readings from Last 3 Encounters:  10/01/19 (!) 123/94  09/29/19 105/75  08/10/19 105/70   Wt Readings from Last 3 Encounters:  09/29/19 300 lb (136.1 kg)  08/10/19 (!) 303 lb (137.4 kg)  05/21/19 (!) 302 lb (137 kg)      Physical Exam Vitals reviewed.  Constitutional:      General: She is not in acute distress.    Appearance: Normal appearance. She is well-developed. She is ill-appearing.  HENT:     Head: Normocephalic and atraumatic.  Pulmonary:     Effort: Pulmonary effort is normal. No respiratory distress.  Musculoskeletal:     Cervical back: Normal range of motion and neck supple.  Neurological:     Mental Status: She is alert.  Psychiatric:        Mood and Affect: Mood normal.        Behavior: Behavior normal.        Thought Content: Thought content normal.        Judgment: Judgment normal.       Assessment & Plan     1. COVID-19 Discussed isolation protocols. Continue to push fluids and stay well hydrated. Can use OTC symptomatic treatment of patient's preference. Continue Albuterol prn. Will send in tessalon perles for cough. Patient is a candidate for [facility] infusion therapy so will arrange for infusion.   Infusion was arranged for tomorrow, but patient declined appt when [facility] infusion clinic called for 09/25/19. - benzonatate (TESSALON) 200 MG capsule; Take 1 capsule (200 mg total) by mouth 3 (three) times daily as needed. (Patient not taking: Reported on 09/29/2019)  Dispense: 30 capsule; Refill: 0  2. Depression, major, single episode, mild (HCC) Worsening recently. Will start Lexapro 10mg  as below. F/U in 4 weeks.  - escitalopram (LEXAPRO) 10 MG tablet; Take 1 tablet (10 mg total) by mouth daily.  Dispense:  30 tablet; Refill: 1  No follow-ups on file.     I discussed the assessment and treatment plan with the patient. The patient was provided an opportunity to ask questions and all were answered. The patient agreed with the plan and demonstrated an understanding of the instructions.   The patient was advised to call back or seek an in-person evaluation if the symptoms worsen or if the condition fails to improve as anticipated.  I provided 35 minutes of non-face-to-face time during this encounter.  , PA-C, have reviewed all documentation for this visit. The documentation on 10/14/19 for the exam, diagnosis, procedures, and orders are all accurate and complete.  10/16/19 [facility] [phone] (phone) [phone] (fax)  [facility]

## 2019-09-24 NOTE — Patient Instructions (Signed)
COVID-19 COVID-19 is a respiratory infection that is caused by a virus called severe acute respiratory syndrome coronavirus 2 (SARS-CoV-2). The disease is also known as coronavirus disease or novel coronavirus. In some people, the virus may not cause any symptoms. In others, it may cause a serious infection. The infection can get worse quickly and can lead to complications, such as:  Pneumonia, or infection of the lungs.  Acute respiratory distress syndrome or ARDS. This is a condition in which fluid build-up in the lungs prevents the lungs from filling with air and passing oxygen into the blood.  Acute respiratory failure. This is a condition in which there is not enough oxygen passing from the lungs to the body or when carbon dioxide is not passing from the lungs out of the body.  Sepsis or septic shock. This is a serious bodily reaction to an infection.  Blood clotting problems.  Secondary infections due to bacteria or fungus.  Organ failure. This is when your body's organs stop working. The virus that causes COVID-19 is contagious. This means that it can spread from person to person through droplets from coughs and sneezes (respiratory secretions). What are the causes? This illness is caused by a virus. You may catch the virus by:  Breathing in droplets from an infected person. Droplets can be spread by a person breathing, speaking, singing, coughing, or sneezing.  Touching something, like a table or a doorknob, that was exposed to the virus (contaminated) and then touching your mouth, nose, or eyes. What increases the risk? Risk for infection You are more likely to be infected with this virus if you:  Are within 6 feet (2 meters) of a person with COVID-19.  Provide care for or live with a person who is infected with COVID-19.  Spend time in crowded indoor spaces or live in shared housing. Risk for serious illness You are more likely to become seriously ill from the virus if  you:  Are 50 years of age or older. The higher your age, the more you are at risk for serious illness.  Live in a nursing home or long-term care facility.  Have cancer.  Have a long-term (chronic) disease such as: ? Chronic lung disease, including chronic obstructive pulmonary disease or asthma. ? A long-term disease that lowers your body's ability to fight infection (immunocompromised). ? Heart disease, including heart failure, a condition in which the arteries that lead to the heart become narrow or blocked (coronary artery disease), a disease which makes the heart muscle thick, weak, or stiff (cardiomyopathy). ? Diabetes. ? Chronic kidney disease. ? Sickle cell disease, a condition in which red blood cells have an abnormal "sickle" shape. ? Liver disease.  Are obese. What are the signs or symptoms? Symptoms of this condition can range from mild to severe. Symptoms may appear any time from 2 to 14 days after being exposed to the virus. They include:  A fever or chills.  A cough.  Difficulty breathing.  Headaches, body aches, or muscle aches.  Runny or stuffy (congested) nose.  A sore throat.  New loss of taste or smell. Some people may also have stomach problems, such as nausea, vomiting, or diarrhea. Other people may not have any symptoms of COVID-19. How is this diagnosed? This condition may be diagnosed based on:  Your signs and symptoms, especially if: ? You live in an area with a COVID-19 outbreak. ? You recently traveled to or from an area where the virus is common. ? You   provide care for or live with a person who was diagnosed with COVID-19. ? You were exposed to a person who was diagnosed with COVID-19.  A physical exam.  Lab tests, which may include: ? Taking a sample of fluid from the back of your nose and throat (nasopharyngeal fluid), your nose, or your throat using a swab. ? A sample of mucus from your lungs (sputum). ? Blood tests.  Imaging tests,  which may include, X-rays, CT scan, or ultrasound. How is this treated? At present, there is no medicine to treat COVID-19. Medicines that treat other diseases are being used on a trial basis to see if they are effective against COVID-19. Your health care provider will talk with you about ways to treat your symptoms. For most people, the infection is mild and can be managed at home with rest, fluids, and over-the-counter medicines. Treatment for a serious infection usually takes places in a hospital intensive care unit (ICU). It may include one or more of the following treatments. These treatments are given until your symptoms improve.  Receiving fluids and medicines through an IV.  Supplemental oxygen. Extra oxygen is given through a tube in the nose, a face mask, or a hood.  Positioning you to lie on your stomach (prone position). This makes it easier for oxygen to get into the lungs.  Continuous positive airway pressure (CPAP) or bi-level positive airway pressure (BPAP) machine. This treatment uses mild air pressure to keep the airways open. A tube that is connected to a motor delivers oxygen to the body.  Ventilator. This treatment moves air into and out of the lungs by using a tube that is placed in your windpipe.  Tracheostomy. This is a procedure to create a hole in the neck so that a breathing tube can be inserted.  Extracorporeal membrane oxygenation (ECMO). This procedure gives the lungs a chance to recover by taking over the functions of the heart and lungs. It supplies oxygen to the body and removes carbon dioxide. Follow these instructions at home: Lifestyle  If you are sick, stay home except to get medical care. Your health care provider will tell you how long to stay home. Call your health care provider before you go for medical care.  Rest at home as told by your health care provider.  Do not use any products that contain nicotine or tobacco, such as cigarettes,  e-cigarettes, and chewing tobacco. If you need help quitting, ask your health care provider.  Return to your normal activities as told by your health care provider. Ask your health care provider what activities are safe for you. General instructions  Take over-the-counter and prescription medicines only as told by your health care provider.  Drink enough fluid to keep your urine pale yellow.  Keep all follow-up visits as told by your health care provider. This is important. How is this prevented?  There is no vaccine to help prevent COVID-19 infection. However, there are steps you can take to protect yourself and others from this virus. To protect yourself:   Do not travel to areas where COVID-19 is a risk. The areas where COVID-19 is reported change often. To identify high-risk areas and travel restrictions, check the CDC travel website: wwwnc.cdc.gov/travel/notices  If you live in, or must travel to, an area where COVID-19 is a risk, take precautions to avoid infection. ? Stay away from people who are sick. ? Wash your hands often with soap and water for 20 seconds. If soap and water   are not available, use an alcohol-based hand sanitizer. ? Avoid touching your mouth, face, eyes, or nose. ? Avoid going out in public, follow guidance from your state and local health authorities. ? If you must go out in public, wear a cloth face covering or face mask. Make sure your mask covers your nose and mouth. ? Avoid crowded indoor spaces. Stay at least 6 feet (2 meters) away from others. ? Disinfect objects and surfaces that are frequently touched every day. This may include:  Counters and tables.  Doorknobs and light switches.  Sinks and faucets.  Electronics, such as phones, remote controls, keyboards, computers, and tablets. To protect others: If you have symptoms of COVID-19, take steps to prevent the virus from spreading to others.  If you think you have a COVID-19 infection, contact  your health care provider right away. Tell your health care team that you think you may have a COVID-19 infection.  Stay home. Leave your house only to seek medical care. Do not use public transport.  Do not travel while you are sick.  Wash your hands often with soap and water for 20 seconds. If soap and water are not available, use alcohol-based hand sanitizer.  Stay away from other members of your household. Let healthy household members care for children and pets, if possible. If you have to care for children or pets, wash your hands often and wear a mask. If possible, stay in your own room, separate from others. Use a different bathroom.  Make sure that all people in your household wash their hands well and often.  Cough or sneeze into a tissue or your sleeve or elbow. Do not cough or sneeze into your hand or into the air.  Wear a cloth face covering or face mask. Make sure your mask covers your nose and mouth. Where to find more information  Centers for Disease Control and Prevention: www.cdc.gov/coronavirus/2019-ncov/index.html  World Health Organization: www.who.int/health-topics/coronavirus Contact a health care provider if:  You live in or have traveled to an area where COVID-19 is a risk and you have symptoms of the infection.  You have had contact with someone who has COVID-19 and you have symptoms of the infection. Get help right away if:  You have trouble breathing.  You have pain or pressure in your chest.  You have confusion.  You have bluish lips and fingernails.  You have difficulty waking from sleep.  You have symptoms that get worse. These symptoms may represent a serious problem that is an emergency. Do not wait to see if the symptoms will go away. Get medical help right away. Call your local emergency services (911 in the [place] Do not drive yourself to the hospital. Let the emergency medical personnel know if you think you have  COVID-19. Summary  COVID-19 is a respiratory infection that is caused by a virus. It is also known as coronavirus disease or novel coronavirus. It can cause serious infections, such as pneumonia, acute respiratory distress syndrome, acute respiratory failure, or sepsis.  The virus that causes COVID-19 is contagious. This means that it can spread from person to person through droplets from breathing, speaking, singing, coughing, or sneezing.  You are more likely to develop a serious illness if you are 50 years of age or older, have a weak immune system, live in a nursing home, or have chronic disease.  There is no medicine to treat COVID-19. Your health care provider will talk with you about ways to treat your symptoms.    Take steps to protect yourself and others from infection. Wash your hands often and disinfect objects and surfaces that are frequently touched every day. Stay away from people who are sick and wear a mask if you are sick. This information is not intended to replace advice given to you by your health care provider. Make sure you discuss any questions you have with your health care provider. Document Revised: 12/08/2018 Document Reviewed: 03/16/2018 Elsevier Patient Education  2020 Elsevier Inc. Escitalopram tablets What is this medicine? ESCITALOPRAM (es sye TAL oh pram) is used to treat depression and certain types of anxiety. This medicine may be used for other purposes; ask your health care provider or pharmacist if you have questions. COMMON BRAND NAME(S): Lexapro What should I tell my health care provider before I take this medicine? They need to know if you have any of these conditions:  bipolar disorder or a family history of bipolar disorder  diabetes  glaucoma  heart disease  kidney or liver disease  receiving electroconvulsive therapy  seizures (convulsions)  suicidal thoughts, plans, or attempt by you or a family member  an unusual or allergic  reaction to escitalopram, the related drug citalopram, other medicines, foods, dyes, or preservatives  pregnant or trying to become pregnant  breast-feeding How should I use this medicine? Take this medicine by mouth with a glass of water. Follow the directions on the prescription label. You can take it with or without food. If it upsets your stomach, take it with food. Take your medicine at regular intervals. Do not take it more often than directed. Do not stop taking this medicine suddenly except upon the advice of your doctor. Stopping this medicine too quickly may cause serious side effects or your condition may worsen. A special MedGuide will be given to you by the pharmacist with each prescription and refill. Be sure to read this information carefully each time. Talk to your pediatrician regarding the use of this medicine in children. Special care may be needed. Overdosage: If you think you have taken too much of this medicine contact a poison control center or emergency room at once. NOTE: This medicine is only for you. Do not share this medicine with others. What if I miss a dose? If you miss a dose, take it as soon as you can. If it is almost time for your next dose, take only that dose. Do not take double or extra doses. What may interact with this medicine? Do not take this medicine with any of the following medications:  certain medicines for fungal infections like fluconazole, itraconazole, ketoconazole, posaconazole, voriconazole  cisapride  citalopram  dronedarone  linezolid  MAOIs like Carbex, Eldepryl, Marplan, Nardil, and Parnate  methylene blue (injected into a vein)  pimozide  thioridazine This medicine may also interact with the following medications:  alcohol  amphetamines  aspirin and aspirin-like medicines  carbamazepine  certain medicines for depression, anxiety, or psychotic disturbances  certain medicines for migraine headache like almotriptan,  eletriptan, frovatriptan, naratriptan, rizatriptan, sumatriptan, zolmitriptan  certain medicines for sleep  certain medicines that treat or prevent blood clots like warfarin, enoxaparin, dalteparin  cimetidine  diuretics  dofetilide  fentanyl  furazolidone  isoniazid  lithium  metoprolol  NSAIDs, medicines for pain and inflammation, like ibuprofen or naproxen  other medicines that prolong the QT interval (cause an abnormal heart rhythm)  procarbazine  rasagiline  supplements like St. John's wort, kava kava, valerian  tramadol  tryptophan  ziprasidone This list may not  describe all possible interactions. Give your health care provider a list of all the medicines, herbs, non-prescription drugs, or dietary supplements you use. Also tell them if you smoke, drink alcohol, or use illegal drugs. Some items may interact with your medicine. What should I watch for while using this medicine? Tell your doctor if your symptoms do not get better or if they get worse. Visit your doctor or health care professional for regular checks on your progress. Because it may take several weeks to see the full effects of this medicine, it is important to continue your treatment as prescribed by your doctor. Patients and their families should watch out for new or worsening thoughts of suicide or depression. Also watch out for sudden changes in feelings such as feeling anxious, agitated, panicky, irritable, hostile, aggressive, impulsive, severely restless, overly excited and hyperactive, or not being able to sleep. If this happens, especially at the beginning of treatment or after a change in dose, call your health care professional. Bonita Quin may get drowsy or dizzy. Do not drive, use machinery, or do anything that needs mental alertness until you know how this medicine affects you. Do not stand or sit up quickly, especially if you are an older patient. This reduces the risk of dizzy or fainting spells.  Alcohol may interfere with the effect of this medicine. Avoid alcoholic drinks. Your mouth may get dry. Chewing sugarless gum or sucking hard candy, and drinking plenty of water may help. Contact your doctor if the problem does not go away or is severe. What side effects may I notice from receiving this medicine? Side effects that you should report to your doctor or health care professional as soon as possible:  allergic reactions like skin rash, itching or hives, swelling of the face, lips, or tongue  anxious  black, tarry stools  changes in vision  confusion  elevated mood, decreased need for sleep, racing thoughts, impulsive behavior  eye pain  fast, irregular heartbeat  feeling faint or lightheaded, falls  feeling agitated, angry, or irritable  hallucination, loss of contact with reality  loss of balance or coordination  loss of memory  painful or prolonged erections  restlessness, pacing, inability to keep still  seizures  stiff muscles  suicidal thoughts or other mood changes  trouble sleeping  unusual bleeding or bruising  unusually weak or tired  vomiting Side effects that usually do not require medical attention (report to your doctor or health care professional if they continue or are bothersome):  changes in appetite  change in sex drive or performance  headache  increased sweating  indigestion, nausea  tremors This list may not describe all possible side effects. Call your doctor for medical advice about side effects. You may report side effects to FDA at 1-800-FDA-1088. Where should I keep my medicine? Keep out of reach of children. Store at room temperature between 15 and 30 degrees C (59 and 86 degrees F). Throw away any unused medicine after the expiration date. NOTE: This sheet is a summary. It may not cover all possible information. If you have questions about this medicine, talk to your doctor, pharmacist, or health care provider.   2020 Elsevier/Gold Standard (2018-01-30 11:21:44)

## 2019-09-25 ENCOUNTER — Ambulatory Visit: Payer: Self-pay

## 2019-09-25 ENCOUNTER — Encounter (INDEPENDENT_AMBULATORY_CARE_PROVIDER_SITE_OTHER): Payer: Self-pay

## 2019-09-25 ENCOUNTER — Encounter: Payer: Self-pay | Admitting: Physician Assistant

## 2019-09-25 ENCOUNTER — Telehealth (HOSPITAL_COMMUNITY): Payer: Self-pay | Admitting: Nurse Practitioner

## 2019-09-25 DIAGNOSIS — U071 COVID-19: Secondary | ICD-10-CM

## 2019-09-25 NOTE — Telephone Encounter (Signed)
Called to discuss with [name] about Covid symptoms and the use of regeneron, a monoclonal antibody infusion for those with mild to moderate Covid symptoms and at a high risk of hospitalization.     Pt is qualified for this infusion at the [facility] due to co-morbid conditions and/or a member of an at-risk group, however declines infusion at this time. Symptoms tier reviewed as well as criteria for ending isolation.  Symptoms reviewed that would warrant ED/Hospital evaluation. Preventative practices reviewed. Patient verbalized understanding. Patient advised to call back if he/she opts to proceed with infusion. Callback number provided. Urgent care and/or ER precautions given for severe symptoms. Last date eligible for infusion: 10/02/19    Patient Active Problem List   Diagnosis Date Noted  . Mitral valve insufficiency 04/24/2019  . Chest pain of uncertain etiology 04/24/2019  . Vitamin D deficiency 04/28/2016  . Laryngopharyngeal reflux 03/18/2016  . 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 Fatigue 12/31/2013  . Morbid obesity (HCC) 07/28/2012  . Tobacco use disorder 07/28/2012     Consuello Masse, NP [facility]  [email or web address]

## 2019-09-25 NOTE — Telephone Encounter (Signed)
Patient called and states that she was diagnosed with COVID-19 Saturday.  She and her daughter have fever 101.3 today. Patient states she has brain fog and asking if it is ok to drive home 1 hour. She states that she had her daughter are visiting her boyfriend's home.  He has tested negative. Patient has congested cough chills from fever. sore throat, body aches.  She has already spoken with Joycelyn Man  PA about symptoms and her positive test. Patient was advised to not drive until she feels up to driving. She was told to treat symptoms with OTC medications rest and plenty of fluids. Patient states she has asthma. She was told that if she feels to tired to get OOB or SOB at rest she should reach out again to her PCP.  She should isolate from Boyfriend and if necessary to be in same room wear a mask.  She was told her boyfriend should quarantine 14 days from exposure. He should retest if he develops symptoms. He can retest but should wait at least 7 days from exposure.  Patient verbalized understanding of all information. Reason for Disposition . [1] COVID-19 diagnosed by positive lab test AND [2] NO symptoms (e.g., cough, fever, others)  Answer Assessment - Initial Assessment Questions 1. COVID-19 DIAGNOSIS: "Who made your Coronavirus (COVID-19) diagnosis?" "Was it confirmed by a positive lab test?" If not diagnosed by a HCP, ask "Are there lots of cases (community spread) where you live?" (See public health department website, if unsure)     Yes confirmed 2. COVID-19 EXPOSURE: "Was there any known exposure to COVID before the symptoms began?" CDC Definition of close contact: within 6 feet (2 meters) for a total of 15 minutes or more over a 24-hour period.     confirmed 3. ONSET: "When did the COVID-19 symptoms start?"     Saturday positive 4. WORST SYMPTOM: "What is your worst symptom?" (e.g., cough, fever, shortness of breath, muscle aches)    Cough fever brain fog, sorethroat 5. COUGH: "Do you  have a cough?" If Yes, ask: "How bad is the cough?"       congested 6. FEVER: "Do you have a fever?" If Yes, ask: "What is your temperature, how was it measured, and when did it start?"    Yes 101.3 7. RESPIRATORY STATUS: "Describe your breathing?" (e.g., shortness of breath, wheezing, unable to speak)      Yes has asthma 8. BETTER-SAME-WORSE: "Are you getting better, staying the same or getting worse compared to yesterday?"  If getting worse, ask, "In what way?"    worse 9. HIGH RISK DISEASE: "Do you have any chronic medical problems?" (e.g., asthma, heart or lung disease, weak immune system, obesity, etc.)     asthma 10. PREGNANCY: "Is there any chance you are pregnant?" "When was your last menstrual period?"       *No Answer* 11. OTHER SYMPTOMS: "Do you have any other symptoms?"  (e.g., chills, fatigue, headache, loss of smell or taste, muscle pain, sore throat; new loss of smell or taste especially support the diagnosis of COVID-19)      Sore throat, fever,fatigue  Protocols used: CORONAVIRUS (COVID-19) DIAGNOSED OR SUSPECTED-A-AH

## 2019-09-28 ENCOUNTER — Telehealth: Payer: Self-pay

## 2019-09-28 NOTE — Telephone Encounter (Signed)
Attempted to call pt. Back in regard to BPA questionnaire. Pt. Reported worsening of cough and shortness of breath. Left message on home and mobile numbers to call back and discuss symptoms. Responded to pt.'s questionnaire as well to call the practice and to go to ED for worsening of symptoms. Spoke with Joni Reining in the practice and made her aware.

## 2019-09-29 ENCOUNTER — Emergency Department (HOSPITAL_COMMUNITY)
Admission: EM | Admit: 2019-09-29 | Discharge: 2019-09-29 | Disposition: A | Discharge status: Home / Self Care | Payer: Medicaid Other | Attending: Emergency Medicine | Admitting: Emergency Medicine

## 2019-09-29 ENCOUNTER — Encounter (HOSPITAL_COMMUNITY): Payer: Self-pay | Admitting: *Deleted

## 2019-09-29 ENCOUNTER — Other Ambulatory Visit: Payer: Self-pay

## 2019-09-29 ENCOUNTER — Emergency Department (HOSPITAL_COMMUNITY): Payer: Medicaid Other

## 2019-09-29 ENCOUNTER — Encounter (INDEPENDENT_AMBULATORY_CARE_PROVIDER_SITE_OTHER): Payer: Self-pay

## 2019-09-29 ENCOUNTER — Telehealth: Payer: Self-pay

## 2019-09-29 DIAGNOSIS — J45909 Unspecified asthma, uncomplicated: Secondary | ICD-10-CM | POA: Diagnosis not present

## 2019-09-29 DIAGNOSIS — R0602 Shortness of breath: Secondary | ICD-10-CM | POA: Diagnosis present

## 2019-09-29 DIAGNOSIS — I10 Essential (primary) hypertension: Secondary | ICD-10-CM | POA: Diagnosis not present

## 2019-09-29 DIAGNOSIS — U071 COVID-19: Secondary | ICD-10-CM | POA: Diagnosis not present

## 2019-09-29 DIAGNOSIS — Z79899 Other long term (current) drug therapy: Secondary | ICD-10-CM | POA: Diagnosis not present

## 2019-09-29 LAB — BASIC METABOLIC PANEL
Anion gap: 5 (ref 5–15)
BUN: 9 mg/dL (ref 6–20)
CO2: 24 mmol/L (ref 22–32)
Calcium: 8.2 mg/dL — ABNORMAL LOW (ref 8.9–10.3)
Chloride: 107 mmol/L (ref 98–111)
Creatinine, Ser: 0.82 mg/dL (ref 0.44–1.00)
GFR calc Af Amer: >60 mL/min (ref >60)
GFR calc non Af Amer: >60 mL/min (ref >60)
Glucose, Bld: 100 mg/dL — ABNORMAL HIGH (ref 70–99)
Potassium: 3.4 mmol/L — ABNORMAL LOW (ref 3.5–5.1)
Sodium: 136 mmol/L (ref 135–145)

## 2019-09-29 LAB — CBC WITH DIFFERENTIAL/PLATELET
Abs Immature Granulocytes: 0 10*3/uL (ref 0.00–0.07)
Basophils Absolute: 0 10*3/uL (ref 0.0–0.1)
Basophils Relative: 0 %
Eosinophils Absolute: 0 10*3/uL (ref 0.0–0.5)
Eosinophils Relative: 1 %
HCT: 38.4 % (ref 36.0–46.0)
Hemoglobin: 12.7 g/dL (ref 12.0–15.0)
Immature Granulocytes: 0 %
Lymphocytes Relative: 32 %
Lymphs Abs: 1.2 10*3/uL (ref 0.7–4.0)
MCH: 29.9 pg (ref 26.0–34.0)
MCHC: 33.1 g/dL (ref 30.0–36.0)
MCV: 90.4 fL (ref 80.0–100.0)
Monocytes Absolute: 0.4 10*3/uL (ref 0.1–1.0)
Monocytes Relative: 11 %
Neutro Abs: 2.1 10*3/uL (ref 1.7–7.7)
Neutrophils Relative %: 56 %
Platelets: 202 10*3/uL (ref 150–400)
RBC: 4.25 MIL/uL (ref 3.87–5.11)
RDW: 13.1 % (ref 11.5–15.5)
WBC: 3.7 10*3/uL — ABNORMAL LOW (ref 4.0–10.5)
nRBC: 0 % (ref 0.0–0.2)

## 2019-09-29 LAB — TROPONIN I (HIGH SENSITIVITY)
Troponin I (High Sensitivity): <2 ng/L (ref <18)
Troponin I (High Sensitivity): <2 ng/L (ref <18)

## 2019-09-29 LAB — PREGNANCY, URINE: Preg Test, Ur: NEGATIVE

## 2019-09-29 LAB — SARS CORONAVIRUS 2 BY RT PCR (HOSPITAL ORDER, PERFORMED IN ~~LOC~~ HOSPITAL LAB): SARS Coronavirus 2: POSITIVE — AB

## 2019-09-29 MED ORDER — IOHEXOL 350 MG/ML SOLN
75.0000 mL | Freq: Once | INTRAVENOUS | Status: AC | PRN
  Administered 2019-09-29: 75 mL via INTRAVENOUS

## 2019-09-29 MED ORDER — ALBUTEROL SULFATE HFA 108 (90 BASE) MCG/ACT IN AERS
4.0000 | INHALATION_SPRAY | Freq: Once | RESPIRATORY_TRACT | Status: AC
  Administered 2019-09-29: 4 via RESPIRATORY_TRACT
  Filled 2019-09-29: qty 6.7

## 2019-09-29 MED ORDER — DEXAMETHASONE SODIUM PHOSPHATE 10 MG/ML IJ SOLN
10.0000 mg | Freq: Once | INTRAMUSCULAR | Status: DC
  Filled 2019-09-29: qty 1

## 2019-09-29 MED ORDER — DEXAMETHASONE 4 MG PO TABS
10.0000 mg | ORAL_TABLET | Freq: Once | ORAL | Status: AC
  Administered 2019-09-29: 10 mg via ORAL
  Filled 2019-09-29: qty 3

## 2019-09-29 MED ORDER — AEROCHAMBER PLUS FLO-VU MISC
1.0000 | Freq: Once | Status: AC
  Administered 2019-09-29: 1
  Filled 2019-09-29: qty 1

## 2019-09-29 MED ORDER — ALBUTEROL SULFATE HFA 108 (90 BASE) MCG/ACT IN AERS
INHALATION_SPRAY | RESPIRATORY_TRACT | Status: AC
  Filled 2019-09-29: qty 6.7

## 2019-09-29 NOTE — ED Triage Notes (Signed)
covid positive diagnosed last week, states she has increasing shortness of breath and was advised by her PCP to come in for evaluation

## 2019-09-29 NOTE — Discharge Instructions (Signed)
At this time there does not appear to be the presence of an emergent medical condition, however there is always the potential for conditions to change. Please read and follow the below instructions.  Please return to the Emergency Department immediately for any new or worsening symptoms. Please be sure to follow up with your Primary Care Provider within one week regarding your visit today; please call their office to schedule an appointment even if you are feeling better for a follow-up visit. The monoclonal antibody infusion team will be reaching out to you shortly to schedule with you a time next week for an infusion.  Please keep your phone on you and get to the infusion when scheduled. Patient plenty water and get plenty of rest.  Use your albuterol inhaler as needed for wheezing, return to the ER if you feel that the albuterol is not helping. Your CT scan today showed opacities in your lower lungs which may be due to Covid.  It is important to discuss these findings with your primary care doctor as further evaluation such as x-rays may be needed to ensure the resolution.  Additionally a hernia was seen on your CT scan discuss that with your primary care doctor as well.  Get help right away if: You have trouble breathing. You have pain or pressure in your chest. You have confusion. You have bluish lips and fingernails. You have difficulty waking from sleep. You have any new/concerning or worsening of symptoms. These symptoms may represent a serious problem that is an emergency. Do not wait to see if the symptoms will go away. Get medical help right away. Call your local emergency services (911 in the [place] Do not drive yourself to the hospital. Let the emergency medical personnel know if you think you have COVID-19.  Please read the additional information packets attached to your discharge summary.  Do not take your medicine if  develop an itchy rash, swelling in your mouth or lips, or  difficulty breathing; call 911 and seek immediate emergency medical attention if this occurs.  You may review your lab tests and imaging results in their entirety on your MyChart account.  Please discuss all results of fully with your primary care provider and other specialist at your follow-up visit.  Note: Portions of this text may have been transcribed using voice recognition software. Every effort was made to ensure accuracy; however, inadvertent computerized transcription errors may still be present.

## 2019-09-29 NOTE — ED Provider Notes (Addendum)
[facility], [place] EMERGENCY DEPARTMENT Provider Note   CSN: [id number] Arrival date & time: 09/29/19  1229     History Chief Complaint  Patient presents with   Shortness of Breath    Amanda [name] is a 29 y.o. female history of obesity, hypertension, allergies, asthma, mitral valve insufficiency.  Patient presents today for shortness of breath and chest pain.  Patient reports that 7 days ago she developed fever and body aches, at that time she went to a testing facility in [facility] and tested positive for COVID-19.  She does not have a copy of that result with her today.  She reports that over the last 7 days she has developed cough which has been nonproductive constant.  Chest pain which has been moderate intensity, intermittent worsened with coughing and with deep breaths, sharp generalized nonradiating.  She reports shortness of breath with her cough and with exertion.  Patient has used her albuterol inhaler twice at home with temporary relief of symptoms.  Denies headache, sore throat, neck stiffness, abdominal pain, nausea/vomiting, diarrhea, extremity swelling/color change, history of blood clot, hemoptysis or any additional concerns.  HPI     Past Medical History:  Diagnosis Date   ADD (attention deficit disorder)    Allergy    Asthma    Blood transfusion without reported diagnosis    Heart murmur    Hypertension    Obesity     Patient Active Problem List   Diagnosis Date Noted   Mitral valve insufficiency 04/24/2019   Chest pain of uncertain etiology 04/24/2019   Vitamin D deficiency 04/28/2016   Laryngopharyngeal reflux 03/18/2016   Tricuspid regurgitation 12/31/2013   Fatigue 12/31/2013   Morbid obesity (HCC) 07/28/2012   Tobacco use disorder 07/28/2012    Past Surgical History:  Procedure Laterality Date   FOOT SURGERY     INTRAUTERINE DEVICE (IUD) INSERTION  10/2018   INTRAUTERINE DEVICE INSERTION       OB History     Gravida  1   Para  1   Term  1   Preterm      AB      Living  1     SAB      TAB      Ectopic      Multiple      Live Births              Family History  Problem Relation Age of Onset   Cancer Paternal Aunt        Breast Cancer   Asthma Mother    Diabetes Mellitus II Sister    Hypertension Maternal Grandfather    Heart disease Maternal Grandfather     Social History   Tobacco Use   Smoking status: Former Smoker    Packs/day: 0.04    Years: 8.00    Pack years: 0.32    Types: Cigarettes    Quit date: 08/06/2016    Years since quitting: 3.1   Smokeless tobacco: Never Used  Vaping Use   Vaping Use: Never used  Substance Use Topics   Alcohol use: No   Drug use: No    Home Medications Prior to Admission medications   Medication Sig Start Date End Date Taking? Authorizing Provider  levocetirizine (XYZAL) 5 MG tablet TAKE 1 TABLET(5 MG) BY MOUTH EVERY EVENING 06/27/19  Yes Margaretann Loveless, PA-C  omeprazole (PRILOSEC) 40 MG capsule TAKE 1 CAPSULE BY MOUTH EVERY MORNING 1 HOUR BEFORE BREAKFAST 05/18/19  Yes Margaretann Loveless, PA-C  benzonatate (TESSALON) 200 MG capsule Take 1 capsule (200 mg total) by mouth 3 (three) times daily as needed. Patient not taking: Reported on 09/29/2019 09/24/19   Margaretann Loveless, PA-C  buPROPion (WELLBUTRIN XL) 300 MG 24 hr tablet TAKE 1 TABLET(300 MG) BY MOUTH DAILY Patient not taking: Reported on 09/29/2019 06/15/19   Margaretann Loveless, PA-C  escitalopram (LEXAPRO) 10 MG tablet Take 1 tablet (10 mg total) by mouth daily. 09/24/19   Margaretann Loveless, PA-C  gentamicin cream (GARAMYCIN) 0.1 % Apply 1 application topically 2 (two) times daily. Patient not taking: Reported on 08/10/2019 07/06/19   Felecia Shelling, DPM  levonorgestrel (MIRENA) 20 MCG/24HR IUD 1 Intra Uterine Device (1 each total) by Intrauterine route once for 1 dose. 11/20/18 11/20/18  Conard Novak, MD  naltrexone (DEPADE) 50 MG tablet TAKE 1/2  TABLET(25 MG) BY MOUTH DAILY Patient not taking: Reported on 09/29/2019 05/21/19   Margaretann Loveless, PA-C    Allergies    Doxycycline  Review of Systems   Review of Systems Ten systems are reviewed and are negative for acute change except as noted in the HPI Physical Exam Updated Vital Signs BP 113/78 (BP Location: Right Arm)    Pulse 89    Temp 97.8 F (36.6 C) (Oral)    Resp 17    Ht 5\' 5"  (1.651 m)    Wt 136.1 kg    SpO2 100%    BMI 49.92 kg/m   Physical Exam Constitutional:      General: She is not in acute distress.    Appearance: Normal appearance. She is well-developed. She is obese. She is not ill-appearing or diaphoretic.  HENT:     Head: Normocephalic and atraumatic.  Eyes:     General: Vision grossly intact. Gaze aligned appropriately.     Pupils: Pupils are equal, round, and reactive to light.  Neck:     Trachea: Trachea and phonation normal.  Cardiovascular:     Rate and Rhythm: Regular rhythm. Tachycardia present.  Pulmonary:     Effort: Pulmonary effort is normal. No tachypnea or respiratory distress.     Breath sounds: Decreased breath sounds and wheezing (Minimal) present.  Chest:     Chest wall: Tenderness present.  Abdominal:     General: There is no distension.     Palpations: Abdomen is soft.     Tenderness: There is no abdominal tenderness. There is no guarding or rebound.  Musculoskeletal:        General: Normal range of motion.     Cervical back: Normal range of motion.     Right lower leg: No tenderness. No edema.     Left lower leg: No tenderness. No edema.  Skin:    General: Skin is warm and dry.  Neurological:     Mental Status: She is alert.     GCS: GCS eye subscore is 4. GCS verbal subscore is 5. GCS motor subscore is 6.     Comments: Speech is clear and goal oriented, follows commands Major Cranial nerves without deficit, no facial droop Moves extremities without ataxia, coordination intact  Psychiatric:        Behavior: Behavior  normal.     ED Results / Procedures / Treatments   Labs (all labs ordered are listed, but only abnormal results are displayed) Labs Reviewed  SARS CORONAVIRUS 2 BY RT PCR (HOSPITAL ORDER, PERFORMED IN [facility]) - Abnormal; Notable for  the following components:      Result Value   SARS Coronavirus 2 POSITIVE (*)    All other components within normal limits  BASIC METABOLIC PANEL - Abnormal; Notable for the following components:   Potassium 3.4 (*)    Glucose, Bld 100 (*)    Calcium 8.2 (*)    All other components within normal limits  CBC WITH DIFFERENTIAL/PLATELET - Abnormal; Notable for the following components:   WBC 3.7 (*)    All other components within normal limits  PREGNANCY, URINE  I-STAT BETA HCG BLOOD, ED ([facility], [facility], [facility] ONLY)  TROPONIN I (HIGH SENSITIVITY)  TROPONIN I (HIGH SENSITIVITY)    EKG EKG Interpretation  Date/Time:  Saturday September 29 2019 14:30:47 EDT Ventricular Rate:  92 PR Interval:    QRS Duration: 78 QT Interval:  362 QTC Calculation: 448 R Axis:   55 Text Interpretation: Sinus rhythm Low voltage, precordial leads Confirmed by Raeford Razor [phone]) on 09/29/2019 5:12:27 PM   Radiology CT Angio Chest PE W and/or Wo Contrast  Result Date: 09/29/2019 CLINICAL DATA:  PE suspected, pleuritic chest pain, shortness of breath, COVID EXAM: CT ANGIOGRAPHY CHEST WITH CONTRAST TECHNIQUE: Multidetector CT imaging of the chest was performed using the standard protocol during bolus administration of intravenous contrast. Multiplanar CT image reconstructions and MIPs were obtained to evaluate the vascular anatomy. CONTRAST:  30mL OMNIPAQUE IOHEXOL 350 MG/ML SOLN COMPARISON:  CT chest abdomen pelvis, 10/17/2017 FINDINGS: Cardiovascular: Satisfactory opacification of the pulmonary arteries to the segmental level. No evidence of pulmonary embolism. Normal heart size. No pericardial effusion. Mediastinum/Nodes: No enlarged mediastinal, hilar, or axillary  lymph nodes. Moderate hiatal hernia. Thyroid gland, trachea, and esophagus demonstrate no significant findings. Lungs/Pleura: Heterogeneous, somewhat nodular airspace opacity throughout the lungs, most conspicuous in the peripheral lung bases. No pleural effusion or pneumothorax. Upper Abdomen: No acute abnormality. Musculoskeletal: No chest wall abnormality. No acute or significant osseous findings. Review of the MIP images confirms the above findings. IMPRESSION: 1. Negative examination for pulmonary embolism. 2. Heterogeneous, somewhat nodular airspace opacity throughout the lungs, most conspicuous in the peripheral lung bases. Findings are in keeping with reported clinical diagnosis of COVID airspace disease. 3. Moderate hiatal hernia. Electronically Signed   By: Lauralyn Primes M.D.   On: 09/29/2019 16:19   DG Chest Port 1 View  Result Date: 09/29/2019 CLINICAL DATA:  COVID-19 positive, worsening dyspnea EXAM: PORTABLE CHEST 1 VIEW COMPARISON:  04/02/2013 chest radiograph. FINDINGS: Stable cardiomediastinal silhouette with normal heart size. No pneumothorax. No pleural effusion. Lungs appear clear, with no acute consolidative airspace disease and no pulmonary edema. IMPRESSION: No active disease. Electronically Signed   By: Delbert Phenix M.D.   On: 09/29/2019 14:17    Procedures Procedures (including critical care time)  Medications Ordered in ED Medications  dexamethasone (DECADRON) injection 10 mg (has no administration in time range)  albuterol (VENTOLIN HFA) 108 (90 Base) MCG/ACT inhaler 4 puff (4 puffs Inhalation Given 09/29/19 1435)  aerochamber plus with mask device 1 each (1 each Other Given 09/29/19 1439)  iohexol (OMNIPAQUE) 350 MG/ML injection 75 mL (75 mLs Intravenous Contrast Given 09/29/19 1530)    ED Course  I have reviewed the triage vital signs and the nursing notes.  Pertinent labs & imaging results that were available during my care of the patient were reviewed by me and considered  in my medical decision making (see chart for details).    MDM Rules/Calculators/A&P  Additional history obtained from: 1. Nursing notes from this visit. 2. Electronic medical record reviewed.  Patient with multiple telephone encounters with her family medicine team.  No Covid test through EMR system. ------------------------------------- 29 year old female presents today for cough shortness of breath and chest pain onset 1 week ago.  She reports a positive Covid test at outpatient facility.  She has not had a previous Covid vaccine.  Chest pain work-up initiated.  Additionally will obtain Covid test as well as patient cannot provide copy of prior.  Additionally given pleuritic chest pain with cough and tachycardia will obtain CT angio PE study for evaluation of pulmonary embolism.  Additionally will give patient albuterol inhaler with a spacer for suspected asthma exacerbation. ==================== I ordered, reviewed and interpreted labs which include: Initial and delta troponin negative. CBC shows mild leukopenia of 3.7, no anemia. BMP shows mild hypokalemia of 3.4, no emergent electrolyte derangement, AKI or gap. Covid test positive.  EKG: Sinus rhythm Low voltage, precordial leads Confirmed by Raeford RazorKohut, Stephen [phone] on 09/29/2019 5:12:27 PM  CXR:  IMPRESSION:  No active disease.   CT Angio PE Study:  IMPRESSION:  1. Negative examination for pulmonary embolism.  2. Heterogeneous, somewhat nodular airspace opacity throughout the  lungs, most conspicuous in the peripheral lung bases. Findings are  in keeping with reported clinical diagnosis of COVID airspace  disease.  3. Moderate hiatal hernia.   Patient ambulated with nursing staff SPO2 remained 96-100% on room air.  Patient reassessed she is resting comfortably in the bed no acute distress vital signs within normal limits on room air.  Suspect patient symptoms secondary to COVID-19 viral infection.  No  evidence of ACS, PE, dissection or other acute cardiopulmonary etiologies at this time.  No indication for antibiotics.  She states understanding of care plan she has no questions, additionally patient was informed of opacity seen on CT scan as well as hiatal hernia and to follow-up with her PCP for reevaluation.  There is no indication for admission at this time. Discussed case with Dr. Juleen ChinaKohut, plan is to refer patient to monoclonal antibody infusion center and give 10 mg Decadron.  At this time there does not appear to be any evidence of an acute emergency medical condition and the patient appears stable for discharge with appropriate outpatient follow up. Diagnosis was discussed with patient who verbalizes understanding of care plan and is agreeable to discharge. I have discussed return precautions with patient who verbalizes understanding. Patient encouraged to follow-up with their PCP. All questions answered.  Patient's case discussed with Dr. Juleen ChinaKohut who agrees with plan to discharge with follow-up.   Amanda [name] was evaluated in Emergency Department on 09/29/2019 for the symptoms described in the history of present illness. She was evaluated in the context of the global COVID-19 pandemic, which necessitated consideration that the patient might be at risk for infection with the SARS-CoV-2 virus that causes COVID-19. Institutional protocols and algorithms that pertain to the evaluation of patients at risk for COVID-19 are in a state of rapid change based on information released by regulatory bodies including the CDC and federal and state organizations. These policies and algorithms were followed during the patient's care in the ED.  Note: Portions of this report may have been transcribed using voice recognition software. Every effort was made to ensure accuracy; however, inadvertent computerized transcription errors may still be present. Final Clinical Impression(s) / ED Diagnoses Final diagnoses:    COVID-19 virus detected  Shortness of breath  Rx / DC Orders ED Discharge Orders    None       Elizabeth Palau 09/29/19 1812    Elizabeth Palau 09/29/19 1912    Raeford Razor, MD 09/30/19 1521

## 2019-09-29 NOTE — ED Notes (Signed)
Date and time results received: 09/29/19 1552  Test: COVID Critical Value: positive  Name of Provider Notified: Olevia Bowens  Orders Received? Or Actions Taken?: N/A

## 2019-09-29 NOTE — Telephone Encounter (Signed)
Cough is worse- x 2 days. Hurts to cough and chest is sore. Cough is dry but can hear congestion. Crackles. Wheezing. Chest tight. No chills. Low grade. Has h/o asthma. SOB with activity.- SOB-  . If cough remains the same or better: continue to treat with over the counter medications.  Hard candy or cough drops and drinking warm fluids. Adults can also use honey 2 tsp (10 ML) at bedtime.  Marland Kitchen Honey is not recommended for infants under one. . If cough is becoming worse even with the use of over the counter medications and patient is not able to sleep at night, cough becomes productive with sputum that maybe yellow or green in color, contact PCP.  Advised pt to get evaluated at [facility]. Pt stated she will go.

## 2019-09-29 NOTE — ED Notes (Signed)
Pt ambulatory around room with pulse O2, pt satting96-100%

## 2019-09-30 ENCOUNTER — Other Ambulatory Visit: Payer: Self-pay | Admitting: Adult Health

## 2019-09-30 ENCOUNTER — Encounter: Payer: Self-pay | Admitting: Physician Assistant

## 2019-09-30 ENCOUNTER — Encounter: Payer: Self-pay | Admitting: Nurse Practitioner

## 2019-09-30 DIAGNOSIS — U071 COVID-19: Secondary | ICD-10-CM

## 2019-09-30 MED ORDER — SODIUM CHLORIDE 0.9 % IV SOLN
1200.0000 mg | Freq: Once | INTRAVENOUS | Status: AC
  Administered 2019-10-01: 1200 mg via INTRAVENOUS
  Filled 2019-09-30: qty 1200

## 2019-09-30 NOTE — Progress Notes (Signed)
I connected by phone with Amanda [name] on 09/30/2019 at 10:24 AM to discuss the potential use of a new treatment for mild to moderate COVID-19 viral infection in non-hospitalized patients.  This patient is a 29 y.o. female that meets the FDA criteria for Emergency Use Authorization of COVID monoclonal antibody casirivimab/imdevimab.  Has a (+) direct SARS-CoV-2 viral test result  Has mild or moderate COVID-19   Is NOT hospitalized due to COVID-19  Is within 10 days of symptom onset  Has at least one of the high risk factor(s) for progression to severe COVID-19 and/or hospitalization as defined in EUA.  Specific high risk criteria : BMI > 25   I have spoken and communicated the following to the patient or parent/caregiver regarding COVID monoclonal antibody treatment:  1. FDA has authorized the emergency use for the treatment of mild to moderate COVID-19 in adults and pediatric patients with positive results of direct SARS-CoV-2 viral testing who are 27 years of age and older weighing at least 40 kg, and who are at high risk for progressing to severe COVID-19 and/or hospitalization.  2. The significant known and potential risks and benefits of COVID monoclonal antibody, and the extent to which such potential risks and benefits are unknown.  3. Information on available alternative treatments and the risks and benefits of those alternatives, including clinical trials.  4. Patients treated with COVID monoclonal antibody should continue to self-isolate and use infection control measures (e.g., wear mask, isolate, social distance, avoid sharing personal items, clean and disinfect "high touch" surfaces, and frequent handwashing) according to CDC guidelines.   5. The patient or parent/caregiver has the option to accept or refuse COVID monoclonal antibody treatment.  After reviewing this information with the patient, The patient agreed to proceed with receiving casirivimab\imdevimab infusion and  will be provided a copy of the Fact sheet prior to receiving the infusion. Noreene Filbert 09/30/2019 10:24 AM

## 2019-10-01 ENCOUNTER — Ambulatory Visit (HOSPITAL_COMMUNITY)
Admission: RE | Admit: 2019-10-01 | Discharge: 2019-10-01 | Disposition: A | Discharge status: Home / Self Care | Payer: Medicaid Other | Source: Ambulatory Visit | Attending: Pulmonary Disease | Admitting: Pulmonary Disease

## 2019-10-01 ENCOUNTER — Ambulatory Visit (HOSPITAL_COMMUNITY): Payer: No Typology Code available for payment source

## 2019-10-01 DIAGNOSIS — U071 COVID-19: Secondary | ICD-10-CM | POA: Diagnosis present

## 2019-10-01 MED ORDER — METHYLPREDNISOLONE SODIUM SUCC 125 MG IJ SOLR: 125.0000 mg | Freq: Once | INTRAMUSCULAR | Status: DC | PRN

## 2019-10-01 MED ORDER — DIPHENHYDRAMINE HCL 50 MG/ML IJ SOLN: 50.0000 mg | Freq: Once | INTRAMUSCULAR | Status: DC | PRN

## 2019-10-01 MED ORDER — FAMOTIDINE IN NACL 20-0.9 MG/50ML-% IV SOLN: 20.0000 mg | Freq: Once | INTRAVENOUS | Status: DC | PRN

## 2019-10-01 MED ORDER — ALBUTEROL SULFATE HFA 108 (90 BASE) MCG/ACT IN AERS: 2.0000 | INHALATION_SPRAY | Freq: Once | RESPIRATORY_TRACT | Status: DC | PRN

## 2019-10-01 MED ORDER — SODIUM CHLORIDE 0.9 % IV SOLN: INTRAVENOUS | Status: DC | PRN

## 2019-10-01 MED ORDER — EPINEPHRINE 0.3 MG/0.3ML IJ SOAJ: 0.3000 mg | Freq: Once | INTRAMUSCULAR | Status: DC | PRN

## 2019-10-01 NOTE — Discharge Instructions (Signed)

## 2019-10-01 NOTE — Progress Notes (Signed)
  Diagnosis: COVID-19  Physician: Dr. Patrick Wright  Procedure: Covid Infusion Clinic Med: casirivimab\imdevimab infusion - Provided patient with casirivimab\imdevimab fact sheet for patients, parents and caregivers prior to infusion.  Complications: No immediate complications noted.  Discharge: Discharged home   [name] S [name] 10/01/2019  

## 2019-10-02 ENCOUNTER — Encounter: Payer: Self-pay | Admitting: Physician Assistant

## 2019-10-02 ENCOUNTER — Ambulatory Visit: Payer: Self-pay | Admitting: Physician Assistant

## 2019-10-02 NOTE — Telephone Encounter (Signed)
Pt calling to see if MyChart message could be addressed. See encounters from today.  Per covid symptom questionnaire symptoms have not worsened.  Pt is questioning if steroids would help "Or anything else I can do for cough and chest tightness." Is using inhalers "More than I should ." Pt had Infusion yesterday.Pt had Infusion yesterday.  States "I don't want to go back to ED."  Attempted to reach office, no answer, close to 5pm.  Assured pt NT would route to practice for PCPs review.  Reiterated need for ED eval if symptoms worsen. Pt verbalizes understanding.

## 2019-10-03 MED ORDER — ALBUTEROL SULFATE HFA 108 (90 BASE) MCG/ACT IN AERS
2.0000 | INHALATION_SPRAY | RESPIRATORY_TRACT | 0 refills | Status: DC | PRN
Start: 2019-10-03 — End: 2020-01-07

## 2019-10-03 NOTE — Telephone Encounter (Signed)
Steroids are not recommended for treatment of COVID 19 outsie of the hospital.  They can actually potentially make the course worse.  Patient may be a candidate for antibody infusion that would be done at an infusion center.  If shortness of breath becomes severe, may need to go to ER.

## 2019-10-04 ENCOUNTER — Encounter (INDEPENDENT_AMBULATORY_CARE_PROVIDER_SITE_OTHER): Payer: Self-pay

## 2019-10-04 NOTE — Telephone Encounter (Signed)
Please advise 

## 2019-10-04 NOTE — Telephone Encounter (Signed)
This is old encounter from 2 days ago. Already addressed.

## 2019-10-05 ENCOUNTER — Encounter (INDEPENDENT_AMBULATORY_CARE_PROVIDER_SITE_OTHER): Payer: Self-pay

## 2019-10-11 ENCOUNTER — Other Ambulatory Visit: Payer: Medicaid Other

## 2019-10-15 ENCOUNTER — Encounter: Payer: Self-pay | Admitting: Physician Assistant

## 2019-10-15 DIAGNOSIS — R3989 Other symptoms and signs involving the genitourinary system: Secondary | ICD-10-CM

## 2019-10-15 MED ORDER — SULFAMETHOXAZOLE-TRIMETHOPRIM 800-160 MG PO TABS: 1.0000 | ORAL_TABLET | Freq: Two times a day (BID) | ORAL | 0 refills | Status: DC

## 2019-10-17 ENCOUNTER — Ambulatory Visit: Payer: Medicaid Other | Admitting: Family

## 2019-11-08 ENCOUNTER — Other Ambulatory Visit: Payer: Medicaid Other

## 2019-11-15 ENCOUNTER — Ambulatory Visit: Payer: Medicaid Other | Admitting: Family

## 2019-11-18 ENCOUNTER — Other Ambulatory Visit: Payer: Self-pay | Admitting: Physician Assistant

## 2019-11-18 DIAGNOSIS — K219 Gastro-esophageal reflux disease without esophagitis: Secondary | ICD-10-CM

## 2019-11-18 NOTE — Telephone Encounter (Signed)
Requested Prescriptions  Pending Prescriptions Disp Refills   omeprazole (PRILOSEC) 40 MG capsule [Pharmacy Med Name: OMEPRAZOLE 40MG  CAPSULES] 90 capsule 1    Sig: TAKE 1 CAPSULE BY MOUTH EVERY MORNING 1 HOUR BEFORE BREAKFAST     Gastroenterology: Proton Pump Inhibitors Passed - 11/18/2019  5:17 PM      Passed - Valid encounter within last 12 months    Recent Outpatient Visits          1 month ago COVID-19   [facility], [place], [place]   3 months ago Exposure to gonorrhea   [facility], [facility], [place]-C   6 months ago Encounter for weight loss counseling   [facility], M   7 months ago Class 3 severe obesity due to excess calories with serious comorbidity and body mass index (BMI) of 45.0 to 49.9 in adult [facility])   [facility] [place], [place], [place]   9 months ago Acute swimmer's ear of right side   [facility] [place], [place], Alessandra Bevels      Future Appointments            In 2 weeks [place], Dan Humphreys, NP [facility]

## 2019-11-20 ENCOUNTER — Other Ambulatory Visit: Payer: Self-pay

## 2019-11-23 ENCOUNTER — Telehealth (INDEPENDENT_AMBULATORY_CARE_PROVIDER_SITE_OTHER): Payer: Medicaid Other | Admitting: Physician Assistant

## 2019-11-23 DIAGNOSIS — J301 Allergic rhinitis due to pollen: Secondary | ICD-10-CM | POA: Diagnosis not present

## 2019-11-23 DIAGNOSIS — F320 Major depressive disorder, single episode, mild: Secondary | ICD-10-CM | POA: Diagnosis not present

## 2019-11-23 MED ORDER — BUPROPION HCL ER (XL) 300 MG PO TB24: ORAL_TABLET | ORAL | 4 refills | Status: DC

## 2019-11-23 MED ORDER — ESCITALOPRAM OXALATE 10 MG PO TABS: 15.0000 mg | ORAL_TABLET | Freq: Every day | ORAL | 1 refills | Status: DC

## 2019-11-23 MED ORDER — LEVOCETIRIZINE DIHYDROCHLORIDE 5 MG PO TABS: ORAL_TABLET | ORAL | 1 refills | Status: DC

## 2019-11-23 NOTE — Patient Instructions (Signed)
10 Relaxation Techniques That Zap Stress Fast By Jeannette Moninger   Listen  Relax. You deserve it, it's good for you, and it takes less time than you think. You don't need a spa weekend or a retreat. Each of these stress-relieving tips can get you from OMG to om in less than 15 minutes. 1. Meditate  A few minutes of practice per day can help ease anxiety. "Research suggests that daily meditation may alter the brain's neural pathways, making you more resilient to stress," says psychologist Robbie Maller Hartman, PhD, a [place] health and wellness coach. It's simple. Sit up straight with both feet on the floor. Close your eyes. Focus your attention on reciting -- out loud or silently -- a positive mantra such as "I feel at peace" or "I love myself." Place one hand on your belly to sync the mantra with your breaths. Let any distracting thoughts float by like clouds. 2. Breathe Deeply  Take a 5-minute break and focus on your breathing. Sit up straight, eyes closed, with a hand on your belly. Slowly inhale through your nose, feeling the breath start in your abdomen and work its way to the top of your head. Reverse the process as you exhale through your mouth.  "Deep breathing counters the effects of stress by slowing the heart rate and lowering blood pressure," psychologist Judith Tutin, PhD, says. She's a certified life coach in [place], [place] 3. Be Present  Slow down.  "Take 5 minutes and focus on only one behavior with awareness," Tutin says. Notice how the air feels on your face when you're walking and how your feet feel hitting the ground. Enjoy the texture and taste of each bite of food. When you spend time in the moment and focus on your senses, you should feel less tense. 4. Reach Out  Your social network is one of your best tools for handling stress. Talk to others -- preferably face to face, or at least on the phone. Share what's going on. You can get a fresh perspective while keeping your  connection strong. 5. Tune In to Your Body  Mentally scan your body to get a sense of how stress affects it each day. Lie on your back, or sit with your feet on the floor. Start at your toes and work your way up to your scalp, noticing how your body feels.  "Simply be aware of places you feel tight or loose without trying to change anything," Tutin says. For 1 to 2 minutes, imagine each deep breath flowing to that body part. Repeat this process as you move your focus up your body, paying close attention to sensations you feel in each body part. 6. Decompress  Place a warm heat wrap around your neck and shoulders for 10 minutes. Close your eyes and relax your face, neck, upper chest, and back muscles. Remove the wrap, and use a tennis ball or foam roller to massage away tension.  "Place the ball between your back and the wall. Lean into the ball, and hold gentle pressure for up to 15 seconds. Then move the ball to another spot, and apply pressure," says Cathy Benninger, a nurse practitioner and assistant professor at [facility] [employer] [facility] in [place]. 7. Laugh Out Loud  A good belly laugh doesn't just lighten the load mentally. It lowers cortisol, your body's stress hormone, and boosts brain chemicals called endorphins, which help your mood. Lighten up by tuning in to your favorite sitcom or video, reading   the comics, or chatting with someone who makes you smile. 8. Crank Up the Tunes  Research shows that listening to soothing music can lower blood pressure, heart rate, and anxiety. "Create a playlist of songs or nature sounds (the ocean, a bubbling brook, birds chirping), and allow your mind to focus on the different melodies, instruments, or singers in the piece," Benninger says. You also can blow off steam by rocking out to more upbeat tunes -- or singing at the top of your lungs! 9. Get Moving  You don't have to run in order to get a runner's high. All forms of exercise,  including yoga and walking, can ease depression and anxiety by helping the brain release feel-good chemicals and by giving your body a chance to practice dealing with stress. You can go for a quick walk around the block, take the stairs up and down a few flights, or do some stretching exercises like head rolls and shoulder shrugs. 10. Be Grateful  Keep a gratitude journal or several (one by your bed, one in your purse, and one at work) to help you remember all the things that are good in your life.  "Being grateful for your blessings cancels out negative thoughts and worries," says Joni Emmerling, a wellness coach in [place], [place].  Use these journals to savor good experiences like a child's smile, a sunshine-filled day, and good health. Don't forget to celebrate accomplishments like mastering a new task at work or a new hobby. When you start feeling stressed, spend a few minutes looking through your notes to remind yourself what really matters.   

## 2019-11-23 NOTE — Progress Notes (Signed)
MyChart Video Visit    Virtual Visit via Video Note   This visit type was conducted due to national recommendations for restrictions regarding the COVID-19 Pandemic (e.g. social distancing) in an effort to limit this patient's exposure and mitigate transmission in our community. This patient is at least at moderate risk for complications without adequate follow up. This format is felt to be most appropriate for this patient at this time. Physical exam was limited by quality of the video and audio technology used for the visit.   Interactive audio and video communications were attempted, although failed due to patient's inability to connect to video. Continued visit with audio only interaction with patient agreement.  Patient location: Home Provider location: [facility]  I discussed the limitations of evaluation and management by telemedicine and the availability of in person appointments. The patient expressed understanding and agreed to proceed.  Patient: Amanda [name]   DOB: Jul 08, 1990   29 y.o. Female  MRN: [id number] Visit Date: 11/23/2019  Today's healthcare provider: Margaretann Loveless, PA-C   No chief complaint on file.  Subjective    HPI  Amanda [name] is a 29 yr old female that presents today for follow up on anxiety and mood. Restarted Lexapro recently. Reports since last check she and her boyfriend have broken up. She reports that she is tolerating this break up better than previous times. Not crying as much. She thinks this is helping her. She has continued to take Wellbutrin XL 300mg  as well. She feels this is a good combination for her. 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 She is interested in possibly increasing lexapro.   Patient Active Problem List   Diagnosis Date Noted  . Mitral valve insufficiency 04/24/2019  . Chest pain of uncertain etiology 04/24/2019  . Vitamin D deficiency 04/28/2016  . Laryngopharyngeal reflux 03/18/2016  . Tricuspid  regurgitation 12/31/2013  . Fatigue 12/31/2013  . Morbid obesity (HCC) 07/28/2012  . Tobacco use disorder 07/28/2012   Past Medical History:  Diagnosis Date  . ADD (attention deficit disorder)   . Allergy   . Asthma   . Blood transfusion without reported diagnosis   . Heart murmur   . Hypertension   . Obesity       Medications: Outpatient Medications Prior to Visit  Medication Sig  . albuterol (VENTOLIN HFA) 108 (90 Base) MCG/ACT inhaler Inhale 2 puffs into the lungs every 4 (four) hours as needed for wheezing or shortness of breath.  . benzonatate (TESSALON) 200 MG capsule Take 1 capsule (200 mg total) by mouth 3 (three) times daily as needed. (Patient not taking: Reported on 09/29/2019)  . buPROPion (WELLBUTRIN XL) 300 MG 24 hr tablet TAKE 1 TABLET(300 MG) BY MOUTH DAILY (Patient not taking: Reported on 09/29/2019)  . escitalopram (LEXAPRO) 10 MG tablet Take 1 tablet (10 mg total) by mouth daily.  11/29/2019 gentamicin cream (GARAMYCIN) 0.1 % Apply 1 application topically 2 (two) times daily. (Patient not taking: Reported on 08/10/2019)  . levocetirizine (XYZAL) 5 MG tablet TAKE 1 TABLET(5 MG) BY MOUTH EVERY EVENING  . levonorgestrel (MIRENA) 20 MCG/24HR IUD 1 Intra Uterine Device (1 each total) by Intrauterine route once for 1 dose.  . naltrexone (DEPADE) 50 MG tablet TAKE 1/2 TABLET(25 MG) BY MOUTH DAILY (Patient not taking: Reported on 09/29/2019)  . omeprazole (PRILOSEC) 40 MG capsule TAKE 1 CAPSULE BY MOUTH EVERY MORNING 1 HOUR BEFORE BREAKFAST  . sulfamethoxazole-trimethoprim (BACTRIM DS) 800-160 MG tablet  Take 1 tablet by mouth 2 (two) times daily.   No facility-administered medications prior to visit.    Review of Systems  Constitutional: Negative for appetite change, chills, fatigue and fever.  Respiratory: Negative for chest tightness and shortness of breath.   Cardiovascular: Negative for chest pain and palpitations.  Gastrointestinal: Negative for abdominal pain, nausea and  vomiting.  Neurological: Negative for dizziness and weakness.  Psychiatric/Behavioral: Positive for dysphoric mood. The patient is nervous/anxious.     Last CBC Lab Results  Component Value Date   WBC 3.7 (L) 09/29/2019   HGB 12.7 09/29/2019   HCT 38.4 09/29/2019   MCV 90.4 09/29/2019   MCH 29.9 09/29/2019   RDW 13.1 09/29/2019   PLT 202 09/29/2019      Objective    There were no vitals taken for this visit. BP Readings from Last 3 Encounters:  10/01/19 (!) 123/94  09/29/19 105/75  08/10/19 105/70   Wt Readings from Last 3 Encounters:  09/29/19 300 lb (136.1 kg)  08/10/19 (!) 303 lb (137.4 kg)  05/21/19 (!) 302 lb (137 kg)      Physical Exam Vitals reviewed.  Constitutional:      General: She is not in acute distress. Pulmonary:     Effort: No respiratory distress.  Neurological:     Mental Status: She is alert.        Assessment & Plan     1. Depression, major, single episode, mild (HCC) Will increase lexapro slowly and go to 15mg  from 10mg . Continue Wellbutrin XL 300mg . F/U in 4-6 weeks. Virtual ok.  - escitalopram (LEXAPRO) 10 MG tablet; Take 1.5 tablets (15 mg total) by mouth daily.  Dispense: 135 tablet; Refill: 1  2. Non-seasonal allergic rhinitis due to pollen Stable. Diagnosis pulled for medication refill. Continue current medical treatment plan. - levocetirizine (XYZAL) 5 MG tablet; TAKE 1 TABLET(5 MG) BY MOUTH EVERY EVENING  Dispense: 90 tablet; Refill: 1   No follow-ups on file.     I discussed the assessment and treatment plan with the patient. The patient was provided an opportunity to ask questions and all were answered. The patient agreed with the plan and demonstrated an understanding of the instructions.   The patient was advised to call back or seek an in-person evaluation if the symptoms worsen or if the condition fails to improve as anticipated.  I provided 16 minutes of non-face-to-face time during this encounter.  , PA-C, have reviewed all documentation for this visit. The documentation on 11/29/19 for the exam, diagnosis, procedures, and orders are all accurate and complete.  [facility] [phone] (phone) [phone] (fax)  [facility]

## 2019-11-29 ENCOUNTER — Encounter: Payer: Self-pay | Admitting: Physician Assistant

## 2019-12-03 ENCOUNTER — Ambulatory Visit: Payer: Self-pay | Admitting: Family

## 2019-12-10 ENCOUNTER — Other Ambulatory Visit: Payer: Medicaid Other

## 2019-12-11 ENCOUNTER — Telehealth: Payer: Self-pay

## 2019-12-11 NOTE — Telephone Encounter (Signed)
Tried to contact patient to discuss Echo and a follow up appointment. I was unable to leave a message on the patient's voice mail at this time to discuss.

## 2019-12-11 NOTE — Telephone Encounter (Signed)
-----   Message from Loman Chroman sent at 12/11/2019  8:58 AM EDT ----- Patient has no showed for her echo 8x. Spoke with Blenda Bridegroom and decided to have patients appointment remain as is for now. Will re-evaluate on 10/21 ----- Message ----- From: Jeannetta Nap, RN Sent: 12/10/2019   5:08 PM EDT To: Festus Aloe, CMA, [facility] Scheduling   ----- Message ----- From: Alver Sorrow, NP Sent: 12/10/2019   4:39 PM EDT To: Sherlynn Stalls Rimmer, RN  Looks like she was seen in clinic 04/2019 by Dr. Okey Dupre and recommended for echo and 80-month follow-up.  Appears she uses her MyChart regularly.  I think it is more than reasonable to schedule echo and then schedule follow-up.  However, if she is concerned regarding echocardiogram she is certainly welcome to see me first and we can discuss.  Alver Sorrow, NP ----- Message ----- From: Jeannetta Nap, RN Sent: 12/10/2019   4:36 PM EDT To: Alver Sorrow, NP  Thoughts? Get echo first?  ----- Message ----- From: Festus Aloe, CMA Sent: 12/10/2019   3:56 PM EDT To: Sherlynn Stalls Rimmer, RN  This patient no showed for the Echo today, 12/10/2019 and has a follow up on 12/13/2019 with Gillian Shields, NP. Does the patient need to be seen or wait until after the Echo.  Thanks, Jasmine December

## 2019-12-13 ENCOUNTER — Ambulatory Visit: Payer: Self-pay | Admitting: Family

## 2019-12-14 ENCOUNTER — Encounter: Payer: Self-pay | Admitting: Family

## 2020-01-07 ENCOUNTER — Encounter: Payer: Self-pay | Admitting: Adult Health

## 2020-01-07 ENCOUNTER — Telehealth (INDEPENDENT_AMBULATORY_CARE_PROVIDER_SITE_OTHER): Payer: Medicaid Other | Admitting: Adult Health

## 2020-01-07 DIAGNOSIS — R059 Cough, unspecified: Secondary | ICD-10-CM | POA: Diagnosis not present

## 2020-01-07 DIAGNOSIS — J069 Acute upper respiratory infection, unspecified: Secondary | ICD-10-CM | POA: Insufficient documentation

## 2020-01-07 MED ORDER — BENZONATATE 100 MG PO CAPS: 100.0000 mg | ORAL_CAPSULE | Freq: Three times a day (TID) | ORAL | 0 refills | Status: DC | PRN

## 2020-01-07 MED ORDER — ALBUTEROL SULFATE HFA 108 (90 BASE) MCG/ACT IN AERS: 1.0000 | INHALATION_SPRAY | RESPIRATORY_TRACT | 0 refills | Status: DC | PRN

## 2020-01-07 NOTE — Patient Instructions (Signed)

## 2020-01-07 NOTE — Progress Notes (Signed)
MyChart Video Visit    Virtual Visit via Video Note   This visit type was conducted due to national recommendations for restrictions regarding the COVID-19 Pandemic (e.g. social distancing) in an effort to limit this patient's exposure and mitigate transmission in our community. This patient is at least at moderate risk for complications without adequate follow up. This format is felt to be most appropriate for this patient at this time. Physical exam was limited by quality of the video and audio technology used for the visit.   Patient location: at home  Provider location: Provider: Provider's office at  [facility], [place].     I discussed the limitations of evaluation and management by telemedicine and the availability of in person appointments. The patient expressed understanding and agreed to proceed.  Patient: Amanda [name]   DOB: December 14, 1990   29 y.o. Female  MRN: [id number] Visit Date: 01/07/2020  Today's healthcare provider: Jairo Ben, FNP   No chief complaint on file.  Subjective    Cough This is a new problem. The cough is productive of purulent sputum. Associated symptoms include chills, a fever, myalgias, nasal congestion, postnasal drip and sweats. Pertinent negatives include no chest pain, ear congestion, ear pain, headaches, heartburn, hemoptysis, rash, rhinorrhea, sore throat, shortness of breath, weight loss or wheezing. Treatments tried: levocetrizine. The treatment provided no relief.    She received Covid vaccine on 12/30/2019. 1st [place] vaccine she reports of two dose series.    Onset of symptoms 01/02/2020. Fever and sore throat  lasted x 1 day now resolved.  She is coughing up sputum with slight yellow color.   Step dad has diarrhea, her friend did recently have covid,she has no   She has no shortness of breath,.  Mild loss of smell.  Denies any wheezing. Denies any pain with breathing.   Denies any loss of taste.      Patient  denies any fever, body aches,chills, rash, chest pain, shortness of breath, nausea, vomiting, or diarrhea.     Allergies  Allergen Reactions  . Doxycycline Nausea And Vomiting      Medications: Outpatient Medications Prior to Visit  Medication Sig  . buPROPion (WELLBUTRIN SR) 150 MG 12 hr tablet Take 150 mg by mouth every morning.  Marland Kitchen buPROPion (WELLBUTRIN XL) 300 MG 24 hr tablet TAKE 1 TABLET(300 MG) BY MOUTH DAILY  . escitalopram (LEXAPRO) 10 MG tablet Take 1.5 tablets (15 mg total) by mouth daily.  . hydrOXYzine (ATARAX/VISTARIL) 25 MG tablet Take by mouth.  . levocetirizine (XYZAL) 5 MG tablet TAKE 1 TABLET(5 MG) BY MOUTH EVERY EVENING  . levonorgestrel (MIRENA) 20 MCG/24HR IUD 1 Intra Uterine Device (1 each total) by Intrauterine route once for 1 dose.  Marland Kitchen omeprazole (PRILOSEC) 40 MG capsule TAKE 1 CAPSULE BY MOUTH EVERY MORNING 1 HOUR BEFORE BREAKFAST  . [DISCONTINUED] albuterol (VENTOLIN HFA) 108 (90 Base) MCG/ACT inhaler Inhale 2 puffs into the lungs every 4 (four) hours as needed for wheezing or shortness of breath.   No facility-administered medications prior to visit.    Review of Systems  Constitutional: Positive for chills and fever. Negative for weight loss.  HENT: Positive for postnasal drip. Negative for ear pain, rhinorrhea and sore throat.   Respiratory: Positive for cough. Negative for hemoptysis, shortness of breath and wheezing.   Cardiovascular: Negative for chest pain.  Gastrointestinal: Negative for heartburn.  Musculoskeletal: Positive for myalgias.  Skin: Negative for rash.  Neurological: Negative for headaches.  Last CBC Lab Results  Component Value Date   WBC 3.7 (L) 09/29/2019   HGB 12.7 09/29/2019   HCT 38.4 09/29/2019   MCV 90.4 09/29/2019   MCH 29.9 09/29/2019   RDW 13.1 09/29/2019   PLT 202 09/29/2019   Last metabolic panel Lab Results  Component Value Date   GLUCOSE 100 (H) 09/29/2019   NA 136 09/29/2019   K 3.4 (L)  09/29/2019   CL 107 09/29/2019   CO2 24 09/29/2019   BUN 9 09/29/2019   CREATININE 0.82 09/29/2019   GFRNONAA >60 09/29/2019   GFRAA >60 09/29/2019   CALCIUM 8.2 (L) 09/29/2019   PROT 7.0 10/17/2017   ALBUMIN 3.6 10/17/2017   LABGLOB 3.0 06/03/2017   AGRATIO 1.3 06/03/2017   BILITOT 0.8 10/17/2017   ALKPHOS 46 10/17/2017   AST 15 10/17/2017   ALT 18 10/17/2017   ANIONGAP 5 09/29/2019      Objective    There were no vitals taken for this visit.No vital signs for this visit.  BP Readings from Last 3 Encounters:  10/01/19 (!) 123/94  09/29/19 105/75  08/10/19 105/70   Wt Readings from Last 3 Encounters:  09/29/19 300 lb (136.1 kg)  08/10/19 (!) 303 lb (137.4 kg)  05/21/19 (!) 302 lb (137 kg)      Physical Exam    Patient is alert and oriented and responsive to questions Engages in conversation with provider. Speaks in full sentences without any pauses without any shortness of breath or distress.    Assessment & Plan       URI, acute - Plan: COVID-19, Flu A+B and RSV  Cough - Plan: COVID-19, Flu A+B and RSV   Meds ordered this encounter  Medications  . benzonatate (TESSALON) 100 MG capsule    Sig: Take 1 capsule (100 mg total) by mouth 3 (three) times daily as needed for cough.    Dispense:  30 capsule    Refill:  0  . albuterol (VENTOLIN HFA) 108 (90 Base) MCG/ACT inhaler    Sig: Inhale 1-2 puffs into the lungs every 4 (four) hours as needed for wheezing or shortness of breath.    Dispense:  18 g    Refill:  0   Testing on 01/07/20 advised as above.   Red Flags discussed. The patient was given clear instructions to go to ER or return to medical center if any red flags develop, symptoms do not improve, worsen or new problems develop. They verbalized understanding.  Return if symptoms worsen or fail to improve, for at any time for any worsening symptoms, Go to Emergency room/ urgent care if worse.     I discussed the assessment and treatment plan with the  patient. The patient was provided an opportunity to ask questions and all were answered. The patient agreed with the plan and demonstrated an understanding of the instructions.   The patient was advised to call back or seek an in-person evaluation if the symptoms worsen or if the condition fails to improve as anticipated.  I provided 20 minutes of non-face-to-face time during this encounter.   Jairo Ben, FNP [facility] [phone] (phone) [phone] (fax)  [facility]

## 2020-01-11 ENCOUNTER — Telehealth: Payer: Self-pay | Admitting: Physician Assistant

## 2020-01-11 NOTE — Telephone Encounter (Signed)
Called [facility] to get the status of the report. They will fax over report as it did not come through electronically. They would not give results over the phone. Please scan report into chart once its received. Just FYI. Thanks!

## 2020-01-11 NOTE — Telephone Encounter (Signed)
Negative flu, covid, RSV faxed to [place] from [facility]. Results reviewed.

## 2020-01-11 NOTE — Telephone Encounter (Signed)
I printed of report and left at front for pick up, her covid flu test still shows active future as of 01/07/20. Please review. KW

## 2020-01-11 NOTE — Telephone Encounter (Signed)
Copied from CRM [id number]. Topic: General - Other >> Jan 11, 2020  1:56 PM Jaquita Rector A wrote: Reason for CRM: Patient called in to inquire if she can get a copy of her immunization record if its on file and also patient say its been 3 days and she have not heard back about her covid or flu test result. Asking for a call back at Ph# [phone]

## 2020-01-11 NOTE — Telephone Encounter (Signed)
Updated Marcelino Duster about the covid test. We should get results shortly. Can you print out immunization record? Thanks!

## 2020-01-11 NOTE — Telephone Encounter (Signed)
Patient is calling to see if her COVID and flu A & B results were back. Advised the patient that the results were not back. Please advise CB- [phone]

## 2020-01-14 ENCOUNTER — Telehealth: Payer: Self-pay | Admitting: *Deleted

## 2020-01-14 NOTE — Telephone Encounter (Signed)
Returned call to pt. Results were not in yet. Called [facility] who stated they will fax [place] a copy. Fax received showing all tests negative. Patient was advised. Patient wants to know if she can have work note for days she missed? 01/06/2020 through today. Please advise?

## 2020-01-14 NOTE — Telephone Encounter (Signed)
Called patient to inform her of recent results as stated below, there was no answer on home phone if patient returns call okay for [facility] triage to advise patient of results. KW

## 2020-01-14 NOTE — Telephone Encounter (Signed)
Copied from CRM [id number]. Topic: General - Other >> Jan 14, 2020 12:42 PM Marylen Ponto wrote: Reason for CRM: Pt called for Covid test results. Pt requests call back

## 2020-01-14 NOTE — Telephone Encounter (Signed)
Called patient no answer mailbox full

## 2020-01-14 NOTE — Telephone Encounter (Signed)
Letter sent through mychart.

## 2020-01-23 ENCOUNTER — Encounter: Payer: Self-pay | Admitting: Physician Assistant

## 2020-01-23 ENCOUNTER — Telehealth: Payer: Self-pay

## 2020-01-23 NOTE — Telephone Encounter (Signed)
See mychart.  

## 2020-01-23 NOTE — Telephone Encounter (Signed)
Copied from CRM [id number]. Topic: General - Other >> Jan 23, 2020  4:02 PM Jaquita Rector A wrote: Reason for CRM: Patient called to request a call back from Joycelyn Man states that she need to talk to her about something with her job. Asking if she can get a call this afternoon please Ph# [phone]

## 2020-01-31 NOTE — Telephone Encounter (Signed)
Patient is aware. KW

## 2020-02-07 ENCOUNTER — Telehealth (INDEPENDENT_AMBULATORY_CARE_PROVIDER_SITE_OTHER): Payer: Medicaid Other | Admitting: Physician Assistant

## 2020-02-07 DIAGNOSIS — J069 Acute upper respiratory infection, unspecified: Secondary | ICD-10-CM

## 2020-02-07 DIAGNOSIS — R0981 Nasal congestion: Secondary | ICD-10-CM | POA: Diagnosis not present

## 2020-02-07 NOTE — Progress Notes (Signed)
MyChart Video Visit    Virtual Visit via Video Note   This visit type was conducted due to national recommendations for restrictions regarding the COVID-19 Pandemic (e.g. social distancing) in an effort to limit this patient's exposure and mitigate transmission in our community. This patient is at least at moderate risk for complications without adequate follow up. This format is felt to be most appropriate for this patient at this time. Physical exam was limited by quality of the video and audio technology used for the visit.   Patient location: Home Provider location: Office   I discussed the limitations of evaluation and management by telemedicine and the availability of in person appointments. The patient expressed understanding and agreed to proceed.  Patient: Amanda [name]   DOB: 04/06/1990   29 y.o. Female  MRN: [id number] Visit Date: 02/07/2020  Today's healthcare provider: Trey Sailors, PA-C   Chief Complaint  Patient presents with  . URI  I,Soma Lizak M Montie Gelardi,acting as a scribe for [employer], PA-C.,have documented all relevant documentation on the behalf of Trey Sailors, PA-C,as directed by  Trey Sailors, PA-C while in the presence of Trey Sailors, PA-C.  Subjective    URI  This is a new problem. The current episode started in the past 7 days. The problem has been gradually worsening. There has been no fever. Associated symptoms include congestion, coughing, rhinorrhea, sneezing and a sore throat. Pertinent negatives include no headaches, nausea, sinus pain, vomiting or wheezing. She has tried nothing for the symptoms. The treatment provided no relief.  Patient states she had COVID test done today,02/07/2020. Patient reports leaving work early due to respiratory symptoms. Reports sinus congestion ongoing for one day. She has a cough with it. Reports her eyes are red and itching. She had COVID in 09/2019.      Medications: Outpatient Medications Prior  to Visit  Medication Sig  . albuterol (VENTOLIN HFA) 108 (90 Base) MCG/ACT inhaler Inhale 1-2 puffs into the lungs every 4 (four) hours as needed for wheezing or shortness of breath.  Marland Kitchen buPROPion (WELLBUTRIN SR) 150 MG 12 hr tablet Take 150 mg by mouth every morning.  Marland Kitchen buPROPion (WELLBUTRIN XL) 300 MG 24 hr tablet TAKE 1 TABLET(300 MG) BY MOUTH DAILY  . hydrOXYzine (ATARAX/VISTARIL) 25 MG tablet Take by mouth.  . levocetirizine (XYZAL) 5 MG tablet TAKE 1 TABLET(5 MG) BY MOUTH EVERY EVENING  . omeprazole (PRILOSEC) 40 MG capsule TAKE 1 CAPSULE BY MOUTH EVERY MORNING 1 HOUR BEFORE BREAKFAST  . benzonatate (TESSALON) 100 MG capsule Take 1 capsule (100 mg total) by mouth 3 (three) times daily as needed for cough. (Patient not taking: Reported on 02/07/2020)  . escitalopram (LEXAPRO) 10 MG tablet Take 1.5 tablets (15 mg total) by mouth daily.  Marland Kitchen levonorgestrel (MIRENA) 20 MCG/24HR IUD 1 Intra Uterine Device (1 each total) by Intrauterine route once for 1 dose.   No facility-administered medications prior to visit.    Review of Systems  Constitutional: Negative for chills, fatigue and fever.  HENT: Positive for congestion, postnasal drip, rhinorrhea, sinus pressure, sneezing and sore throat. Negative for sinus pain.   Eyes: Positive for discharge and redness.  Respiratory: Positive for cough, chest tightness and shortness of breath. Negative for wheezing.   Gastrointestinal: Negative for nausea and vomiting.  Neurological: Negative for weakness and headaches.      Objective    There were no vitals taken for this visit.   Physical Exam Constitutional:  Appearance: Normal appearance.  Pulmonary:     Effort: Pulmonary effort is normal. No respiratory distress.  Neurological:     Mental Status: She is alert.  Psychiatric:        Mood and Affect: Mood normal.        Behavior: Behavior normal.        Assessment & Plan    1. URI, acute  Recommend OTC treatment including  delsym, mucinex, and 2nd gen antihistamine. Counseled eyes itching likely because of viral conjunctivitis. Counseled regarding signs and symptoms of viral and bacterial respiratory infections. Advised to call or return for additional evaluation if she develops any sign of bacterial infection, or if current symptoms last longer than 10 days.    2. Sinus congestion     I discussed the assessment and treatment plan with the patient. The patient was provided an opportunity to ask questions and all were answered. The patient agreed with the plan and demonstrated an understanding of the instructions.   The patient was advised to call back or seek an in-person evaluation if the symptoms worsen or if the condition fails to improve as anticipated.   ITrey Sailors, PA-C, have reviewed all documentation for this visit. The documentation on 02/07/20 for the exam, diagnosis, procedures, and orders are all accurate and complete.  The entirety of the information documented in the History of Present Illness, Review of Systems and Physical Exam were personally obtained by me. Portions of this information were initially documented by [facility] and reviewed by me for thoroughness and accuracy.    Maryella Shivers [facility] [phone] (phone) [phone] (fax)  [facility]

## 2020-02-08 ENCOUNTER — Telehealth: Payer: Self-pay

## 2020-02-08 NOTE — Telephone Encounter (Signed)
Copied from CRM [id number]. Topic: General - Other >> Feb 08, 2020  2:15 PM [name] wrote: Patient stated that her employer needs Amanda [name] to order a viral conjunctivitis test and also to know when Amanda [name] will clear her to return to work. Please advise

## 2020-02-11 ENCOUNTER — Encounter: Payer: Self-pay | Admitting: Physician Assistant

## 2020-02-12 NOTE — Telephone Encounter (Signed)
Please review

## 2020-02-13 NOTE — Telephone Encounter (Signed)
I have never ordered that nor not sure how to test for that. We normally determine type of conjunctivitis based on physical exam and symptoms.   It may be something she has to see an eye doctor for this.

## 2020-02-14 NOTE — Telephone Encounter (Signed)
NA unable to LM-mailbox full-Ok for [facility] nurse to give patient providers message

## 2020-03-18 ENCOUNTER — Other Ambulatory Visit: Payer: Medicaid Other

## 2020-03-20 ENCOUNTER — Ambulatory Visit: Payer: Medicaid Other | Admitting: Family

## 2020-03-20 NOTE — Progress Notes (Deleted)
Office Visit    Patient Name: Amanda [name] Date of Encounter: 03/20/2020  Primary Care Provider:  Margaretann Loveless, PA-C Primary Cardiologist:  No primary care provider on file. Electrophysiologist:  None   Chief Complaint    Amanda [name] is a 30 y.o. female with a hx of hypertension, mitral and tricuspid regurgitation, asthma, ADD presents today for ***   Past Medical History    Past Medical History:  Diagnosis Date  . ADD (attention deficit disorder)   . Allergy   . Asthma   . Blood transfusion without reported diagnosis   . Heart murmur   . Hypertension   . Obesity    Past Surgical History:  Procedure Laterality Date  . FOOT SURGERY    . INTRAUTERINE DEVICE (IUD) INSERTION  10/2018  . INTRAUTERINE DEVICE INSERTION      Allergies  Allergies  Allergen Reactions  . Doxycycline Nausea And Vomiting    History of Present Illness    Amanda [name] is a 30 y.o. female with a hx of hypertension, mitral and tricuspid regurgitation, asthma, ADD. She was last seen 04/23/2019 by Dr. Okey Dupre.  She was previously evaluated by Dr. Welton Flakes and underwent stress testing and echocardiography. Echo most recently 3 to 4 years ago showed mild TR and MR as well as "stiffening of the heart " (report unavailable for review).  When seen by Dr. Okey Dupre she reported frequent chest pain extending from the front of her chest to the back and also radiating to left shoulder. The pain is nonexertional only lasts a few seconds. She has chronic exertional dyspnea which she was uncertain if it was related to her heart, asthma, weight. She reported occasional orthostatic lightheadedness but no syncope. She was recommended for echocardiogram for monitoring of valvular regurgitation. Her chest pain was low suspicion for ASCVD and she was recommended for over-the-counter NSAID for likely musculoskeletal etiology.  She has no showed or canceled 6 scheduled echocardiograms.   EKGs/Labs/Other Studies Reviewed:    The following studies were reviewed today: ***  EKG:  EKG is *** ordered today.  The ekg ordered today demonstrates ***  Recent Labs: 09/29/2019: BUN 9; Creatinine, Ser 0.82; Hemoglobin 12.7; Platelets 202; Potassium 3.4; Sodium 136  Recent Lipid Panel    Component Value Date/Time   CHOL 140 06/03/2017 1140   TRIG 65 06/03/2017 1140   HDL 50 06/03/2017 1140   CHOLHDL 2.8 06/03/2017 1140   LDLCALC 77 06/03/2017 1140    Risk Assessment/Calculations:  {Does this patient have ATRIAL FIBRILLATION?:[id number] 2017}  Home Medications   Current Meds  Medication Sig  . ARIPiprazole (ABILIFY) 2 MG tablet Take by mouth.  . escitalopram (LEXAPRO) 5 MG tablet Take by mouth.     Review of Systems   ***   ROS All other systems reviewed and are otherwise negative except as noted above.  Physical Exam    VS:  There were no vitals taken for this visit. , BMI There is no height or weight on file to calculate BMI.  Wt Readings from Last 3 Encounters:  09/29/19 300 lb (136.1 kg)  08/10/19 (!) 303 lb (137.4 kg)  05/21/19 (!) 302 lb (137 kg)     GEN: Well nourished, well developed, in no acute distress. HEENT: normal. Neck: Supple, no JVD, carotid bruits, or masses. Cardiac: ***RRR, no murmurs, rubs, or gallops. No clubbing, cyanosis, edema.  ***Radials/DP/PT 2+ and equal bilaterally.  Respiratory:  ***Respirations regular and unlabored, clear to auscultation bilaterally. GI:  Soft, nontender, nondistended. MS: No deformity or atrophy. Skin: Warm and dry, no rash. Neuro:  Strength and sensation are intact. Psych: Normal affect.  Assessment & Plan    1. ***  Disposition: Follow up {follow up:15908} with ***   Signed, Alver Sorrow, NP 03/20/2020, 11:04 AM [facility]

## 2020-04-22 ENCOUNTER — Telehealth: Payer: Self-pay

## 2020-04-22 NOTE — Telephone Encounter (Signed)
Copied from CRM [id number]. Topic: General - Other >> Apr 21, 2020  3:30 PM Gaetana Michaelis A wrote: Reason for CRM: Patient has an appt with their podiatrist on 04/24/20 Patient has made contact requesting a prescription from PCP for an antibiotic to help with pain in thieir big toe on the left foot Patient's podiatrist shared that they would be unable to prescribe them something until Thursday  Patient declined to make an appt to see PCP at the time of call with agent  Please contact to advise

## 2020-04-22 NOTE — Telephone Encounter (Signed)
Pt states she can't make a virtual visit today but will sent a picture through Mychart.   Thanks,   -Vernona Rieger

## 2020-04-22 NOTE — Telephone Encounter (Signed)
Patient would need appt, virtual is ok. She would just have to have camera access so we could visualize the toe

## 2020-04-23 ENCOUNTER — Encounter: Payer: Self-pay | Admitting: Physician Assistant

## 2020-04-24 ENCOUNTER — Encounter: Payer: Self-pay | Admitting: Podiatry

## 2020-04-24 ENCOUNTER — Other Ambulatory Visit: Payer: Self-pay

## 2020-04-24 ENCOUNTER — Ambulatory Visit (INDEPENDENT_AMBULATORY_CARE_PROVIDER_SITE_OTHER): Payer: Medicaid Other | Admitting: Podiatry

## 2020-04-24 ENCOUNTER — Encounter: Payer: Self-pay | Admitting: *Deleted

## 2020-04-24 DIAGNOSIS — L600 Ingrowing nail: Secondary | ICD-10-CM | POA: Diagnosis not present

## 2020-04-24 DIAGNOSIS — L603 Nail dystrophy: Secondary | ICD-10-CM | POA: Diagnosis not present

## 2020-04-24 NOTE — Progress Notes (Signed)
Subjective:  Patient ID: Amanda [name], female    DOB: 08-13-1990,  MRN: [id number]  Chief Complaint  Patient presents with  . Ingrown Toenail    Patient presents today for recurrent ingrown toenail left hallux lateral border that was infected x 1 week.     30 y.o. female presents with the above complaint.  Patient presents with complaint left hallux lateral border ingrown.  Patient states it was infected and has been on for a week.  Patient had a removed previously greater than 8 months ago.  She would like to have it removed again.  She denies any other acute complaints.  She is known to Dr. Logan Bores.   Review of Systems: Negative except as noted in the HPI. Denies N/V/F/Ch.  Past Medical History:  Diagnosis Date  . ADD (attention deficit disorder)   . Allergy   . Asthma   . Blood transfusion without reported diagnosis   . Heart murmur   . Hypertension   . Obesity     Current Outpatient Medications:  .  cephALEXin (KEFLEX) 500 MG capsule, Take by mouth., Disp: , Rfl:  .  ibuprofen (ADVIL) 600 MG tablet, Take by mouth., Disp: , Rfl:  .  albuterol (VENTOLIN HFA) 108 (90 Base) MCG/ACT inhaler, Inhale 1-2 puffs into the lungs every 4 (four) hours as needed for wheezing or shortness of breath., Disp: 18 g, Rfl: 0 .  ARIPiprazole (ABILIFY) 2 MG tablet, Take by mouth., Disp: , Rfl:  .  buPROPion (WELLBUTRIN SR) 150 MG 12 hr tablet, Take 150 mg by mouth every morning., Disp: , Rfl:  .  escitalopram (LEXAPRO) 5 MG tablet, Take by mouth., Disp: , Rfl:  .  levocetirizine (XYZAL) 5 MG tablet, TAKE 1 TABLET(5 MG) BY MOUTH EVERY EVENING, Disp: 90 tablet, Rfl: 1 .  levonorgestrel (MIRENA) 20 MCG/24HR IUD, 1 Intra Uterine Device (1 each total) by Intrauterine route once for 1 dose., Disp: 1 each, Rfl: 0 .  omeprazole (PRILOSEC) 40 MG capsule, TAKE 1 CAPSULE BY MOUTH EVERY MORNING 1 HOUR BEFORE BREAKFAST, Disp: 90 capsule, Rfl: 1  Social History   Tobacco Use  Smoking Status Former Smoker  .  Packs/day: 0.04  . Years: 8.00  . Pack years: 0.32  . Types: Cigarettes  . Quit date: 08/06/2016  . Years since quitting: 3.7  Smokeless Tobacco Never Used    Allergies  Allergen Reactions  . Doxycycline Nausea And Vomiting   Objective:  There were no vitals filed for this visit. There is no height or weight on file to calculate BMI. Constitutional Well developed. Well nourished.  Vascular Dorsalis pedis pulses palpable bilaterally. Posterior tibial pulses palpable bilaterally. Capillary refill normal to all digits.  No cyanosis or clubbing noted. Pedal hair growth normal.  Neurologic Normal speech. Oriented to person, place, and time. Epicritic sensation to light touch grossly present bilaterally.  Dermatologic Painful ingrowing nail at lateral nail borders of the hallux nail left. No other open wounds. No skin lesions.  Orthopedic: Normal joint ROM without pain or crepitus bilaterally. No visible deformities. No bony tenderness.   Radiographs: None Assessment:   1. Ingrown left big toenail   2. Nail dystrophy    Plan:  Patient was evaluated and treated and all questions answered.  Ingrown Nail, left with underlying nail dystrophy -Patient elects to proceed with minor surgery to remove ingrown toenail removal today. Consent reviewed and signed by patient. -Ingrown nail excised. See procedure note. -Educated on post-procedure care including soaking. Written  instructions provided and reviewed. -Patient to follow up in 2 weeks for nail check.  Procedure: Excision of Ingrown Toenail Location: Left 1st toe lateral nail borders. Anesthesia: Lidocaine 1% plain; 1.5 mL and Marcaine 0.5% plain; 1.5 mL, digital block. Skin Prep: Betadine. Dressing: Silvadene; telfa; dry, sterile, compression dressing. Technique: Following skin prep, the toe was exsanguinated and a tourniquet was secured at the base of the toe. The affected nail border was freed, split with a nail splitter,  and excised. Chemical matrixectomy was then performed with phenol and irrigated out with alcohol. The tourniquet was then removed and sterile dressing applied. Disposition: Patient tolerated procedure well. Patient to return in 2 weeks for follow-up.   No follow-ups on file.

## 2020-04-24 NOTE — Patient Instructions (Signed)

## 2020-04-25 ENCOUNTER — Encounter: Payer: Self-pay | Admitting: Adult Health

## 2020-05-09 ENCOUNTER — Telehealth: Payer: Self-pay | Admitting: Physician Assistant

## 2020-05-09 NOTE — Telephone Encounter (Signed)
Needs appt to discuss, virtual ok

## 2020-05-09 NOTE — Telephone Encounter (Signed)
MyChart Video appointment scheduled.

## 2020-05-09 NOTE — Telephone Encounter (Signed)
Attempted to reach patient but voicemail box is full if patient returns call okay for [facility] triage to advise patient that an office visit would be needed to discuss. KW

## 2020-05-09 NOTE — Telephone Encounter (Signed)
Pt is calling to ask Antony Contras about getting a CPAP machine and doing a sleep study. Pt would like to know how to get that process started?  Please advise Cb- [phone]

## 2020-05-14 ENCOUNTER — Ambulatory Visit: Payer: Medicaid Other | Attending: Neurology

## 2020-05-14 ENCOUNTER — Telehealth (INDEPENDENT_AMBULATORY_CARE_PROVIDER_SITE_OTHER): Payer: Medicaid Other | Admitting: Physician Assistant

## 2020-05-14 ENCOUNTER — Other Ambulatory Visit: Payer: Self-pay

## 2020-05-14 DIAGNOSIS — R0683 Snoring: Secondary | ICD-10-CM | POA: Insufficient documentation

## 2020-05-14 DIAGNOSIS — R400 Somnolence: Secondary | ICD-10-CM | POA: Insufficient documentation

## 2020-05-14 DIAGNOSIS — R5382 Chronic fatigue, unspecified: Secondary | ICD-10-CM

## 2020-05-14 DIAGNOSIS — Z6841 Body Mass Index (BMI) 40.0 and over, adult: Secondary | ICD-10-CM | POA: Diagnosis not present

## 2020-05-14 NOTE — Progress Notes (Signed)
MyChart Video Visit    Virtual Visit via Video Note   This visit type was conducted due to national recommendations for restrictions regarding the COVID-19 Pandemic (e.g. social distancing) in an effort to limit this patient's exposure and mitigate transmission in our community. This patient is at least at moderate risk for complications without adequate follow up. This format is felt to be most appropriate for this patient at this time. Physical exam was limited by quality of the video and audio technology used for the visit.   Patient location: Home Provider location: [facility]  I discussed the limitations of evaluation and management by telemedicine and the availability of in person appointments. The patient expressed understanding and agreed to proceed.  Patient: Amanda [name]   DOB: 1990-03-02   30 y.o. Female  MRN: [id number] Visit Date: 05/14/2020  Today's healthcare provider: Margaretann Loveless, PA-C   Chief Complaint  Patient presents with  . Sleep Apnea   Subjective    HPI  Patient C/O sleep apnea, and is requesting a sleep study. Patient reports she has trouble falling asleep and wakes up several times during the night.   Results of the Epworth flowsheet 05/14/2020  Sitting and reading 3  Watching TV 2  Sitting, inactive in a public place (e.g. a theatre or a meeting) 0  As a passenger in a car for an hour without a break 1  Lying down to rest in the afternoon when circumstances permit 3  Sitting and talking to someone 0  Sitting quietly after a lunch without alcohol 1  In a car, while stopped for a few minutes in traffic 2  Total score 12   Patient Active Problem List   Diagnosis Date Noted  . URI, acute 01/07/2020  . Mitral valve insufficiency 04/24/2019  . Chest pain of uncertain etiology 04/24/2019  . Vitamin D deficiency 04/28/2016  . Laryngopharyngeal reflux 03/18/2016  . 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 Fatigue 12/31/2013  . Morbid obesity (HCC)  07/28/2012  . Tobacco use disorder 07/28/2012   Social History   Tobacco Use  . Smoking status: Former Smoker    Packs/day: 0.04    Years: 8.00    Pack years: 0.32    Types: Cigarettes    Quit date: 08/06/2016    Years since quitting: 3.7  . Smokeless tobacco: Never Used  Vaping Use  . Vaping Use: Never used  Substance Use Topics  . Alcohol use: No  . Drug use: No   Allergies  Allergen Reactions  . Doxycycline Nausea And Vomiting      Medications: Outpatient Medications Prior to Visit  Medication Sig  . albuterol (VENTOLIN HFA) 108 (90 Base) MCG/ACT inhaler Inhale 1-2 puffs into the lungs every 4 (four) hours as needed for wheezing or shortness of breath.  . ARIPiprazole (ABILIFY) 2 MG tablet Take 2 mg by mouth daily.  Marland Kitchen atomoxetine (STRATTERA) 40 MG capsule Take 1 capsule by mouth daily.  Marland Kitchen buPROPion (WELLBUTRIN SR) 150 MG 12 hr tablet Take 150 mg by mouth every morning.  Marland Kitchen ibuprofen (ADVIL) 600 MG tablet Take by mouth every 8 (eight) hours as needed.  Marland Kitchen levocetirizine (XYZAL) 5 MG tablet TAKE 1 TABLET(5 MG) BY MOUTH EVERY EVENING  . levonorgestrel (MIRENA) 20 MCG/24HR IUD 1 Intra Uterine Device (1 each total) by Intrauterine route once for 1 dose.  Marland Kitchen omeprazole (PRILOSEC) 40 MG capsule TAKE 1 CAPSULE BY MOUTH EVERY MORNING 1 HOUR BEFORE BREAKFAST  . [DISCONTINUED] escitalopram (LEXAPRO)  5 MG tablet Take by mouth. (Patient not taking: Reported on 05/14/2020)   No facility-administered medications prior to visit.    Review of Systems  Constitutional: Positive for fatigue. Negative for appetite change.  Respiratory: Negative for cough and chest tightness.   Cardiovascular: Negative for chest pain and palpitations.  Psychiatric/Behavioral: The patient has insomnia.     Last CBC Lab Results  Component Value Date   WBC 3.7 (L) 09/29/2019   HGB 12.7 09/29/2019   HCT 38.4 09/29/2019   MCV 90.4 09/29/2019   MCH 29.9 09/29/2019   RDW 13.1 09/29/2019   PLT 202  09/29/2019   Last metabolic panel Lab Results  Component Value Date   GLUCOSE 100 (H) 09/29/2019   NA 136 09/29/2019   K 3.4 (L) 09/29/2019   CL 107 09/29/2019   CO2 24 09/29/2019   BUN 9 09/29/2019   CREATININE 0.82 09/29/2019   GFRNONAA >60 09/29/2019   GFRAA >60 09/29/2019   CALCIUM 8.2 (L) 09/29/2019   PROT 7.0 10/17/2017   ALBUMIN 3.6 10/17/2017   LABGLOB 3.0 06/03/2017   AGRATIO 1.3 06/03/2017   BILITOT 0.8 10/17/2017   ALKPHOS 46 10/17/2017   AST 15 10/17/2017   ALT 18 10/17/2017   ANIONGAP 5 09/29/2019   Last lipids Lab Results  Component Value Date   CHOL 140 06/03/2017   HDL 50 06/03/2017   LDLCALC 77 06/03/2017   TRIG 65 06/03/2017   CHOLHDL 2.8 06/03/2017      Objective    There were no vitals taken for this visit. BP Readings from Last 3 Encounters:  10/01/19 (!) 123/94  09/29/19 105/75  08/10/19 105/70   Wt Readings from Last 3 Encounters:  09/29/19 300 lb (136.1 kg)  08/10/19 (!) 303 lb (137.4 kg)  05/21/19 (!) 302 lb (137 kg)      Physical Exam Vitals reviewed.  Constitutional:      General: She is not in acute distress.    Appearance: Normal appearance. She is well-developed. She is not ill-appearing.  HENT:     Head: Normocephalic and atraumatic.  Pulmonary:     Effort: Pulmonary effort is normal. No respiratory distress.  Musculoskeletal:     Cervical back: Normal range of motion and neck supple.  Neurological:     Mental Status: She is alert.  Psychiatric:        Mood and Affect: Mood normal.        Behavior: Behavior normal.        Thought Content: Thought content normal.        Judgment: Judgment normal.       Assessment & Plan     1. Snoring Patient having worsening daytime somnolence and fatigue. Does admit to snoring. Is obese. Will get home sleep study to evaluate for sleep apnea. Will follow up pending results.  - Home sleep test  2. Daytime somnolence See above medical treatment plan. - Home sleep test  3.  Chronic fatigue See above medical treatment plan. - Home sleep test  4. Class 3 severe obesity due to excess calories with serious comorbidity and body mass index (BMI) of 40.0 to 44.9 in adult [facility]) See above medical treatment plan. - Home sleep test   No follow-ups on file.     I discussed the assessment and treatment plan with the patient. The patient was provided an opportunity to ask questions and all were answered. The patient agreed with the plan and demonstrated an understanding of the instructions.  The patient was advised to call back or seek an in-person evaluation if the symptoms worsen or if the condition fails to improve as anticipated.  I provided 14 minutes of face-to-face time during this encounter via MyChart Video enabled encounter.  Delmer Islam, PA-C, have reviewed all documentation for this visit. The documentation on 05/30/20 for the exam, diagnosis, procedures, and orders are all accurate and complete.  Reine Just [facility] [phone] (phone) [phone] (fax)  [facility]

## 2020-05-21 ENCOUNTER — Encounter: Payer: Self-pay | Admitting: Physician Assistant

## 2020-05-30 ENCOUNTER — Encounter: Payer: Self-pay | Admitting: Physician Assistant

## 2020-06-10 ENCOUNTER — Telehealth: Payer: Self-pay

## 2020-06-10 ENCOUNTER — Ambulatory Visit: Payer: Medicaid Other | Admitting: Podiatry

## 2020-06-10 NOTE — Telephone Encounter (Signed)
Copied from CRM [id number]. Topic: General - Other >> Jun 10, 2020  3:28 PM Pawlus, Amanda [name] wrote: Reason for CRM: Pt stated she thinks she has [name] UTI, scheduled [name] virtual visit on 4/26 but wanted to know if she has to wait until her visit to get something called in. Please advise.

## 2020-06-10 NOTE — Telephone Encounter (Signed)
Please advise 

## 2020-06-11 ENCOUNTER — Telehealth: Payer: Self-pay

## 2020-06-11 DIAGNOSIS — K219 Gastro-esophageal reflux disease without esophagitis: Secondary | ICD-10-CM

## 2020-06-11 MED ORDER — OMEPRAZOLE 40 MG PO CPDR: 40.0000 mg | DELAYED_RELEASE_CAPSULE | Freq: Every day | ORAL | 1 refills | Status: DC

## 2020-06-11 NOTE — Telephone Encounter (Signed)
[facility] faxed refill request for the following medications: ° °omeprazole (PRILOSEC) 40 MG capsule  ° °Please advise. ° °

## 2020-06-12 NOTE — Telephone Encounter (Signed)
Push fluids, try Azo and use cranberry juice daily

## 2020-06-12 NOTE — Telephone Encounter (Signed)
Left detailed voicemail for patient.

## 2020-06-17 ENCOUNTER — Ambulatory Visit: Payer: Medicaid Other | Admitting: Family Medicine

## 2020-06-18 ENCOUNTER — Telehealth (INDEPENDENT_AMBULATORY_CARE_PROVIDER_SITE_OTHER): Payer: Medicaid Other | Admitting: Family Medicine

## 2020-06-18 ENCOUNTER — Encounter: Payer: Self-pay | Admitting: Family Medicine

## 2020-06-18 DIAGNOSIS — R350 Frequency of micturition: Secondary | ICD-10-CM | POA: Diagnosis not present

## 2020-06-18 MED ORDER — SULFAMETHOXAZOLE-TRIMETHOPRIM 800-160 MG PO TABS: 1.0000 | ORAL_TABLET | Freq: Two times a day (BID) | ORAL | 0 refills | Status: AC

## 2020-06-18 NOTE — Addendum Note (Signed)
Addended by: Kavin Leech E on: 06/18/2020 09:20 AM   Modules accepted: Orders

## 2020-06-18 NOTE — Patient Instructions (Signed)
Urinary Tract Infection, Adult A urinary tract infection (UTI) is an infection of any part of the urinary tract. The urinary tract includes:  The kidneys.  The ureters.  The bladder.  The urethra. These organs make, store, and get rid of pee (urine) in the body. What are the causes? This infection is caused by germs (bacteria) in your genital area. These germs grow and cause swelling (inflammation) of your urinary tract. What increases the risk? The following factors may make you more likely to develop this condition:  Using a small, thin tube (catheter) to drain pee.  Not being able to control when you pee or poop (incontinence).  Being female. If you are female, these things can increase the risk: ? Using these methods to prevent pregnancy:  A medicine that kills sperm (spermicide).  A device that blocks sperm (diaphragm). ? Having low levels of a female hormone (estrogen). ? Being pregnant. You are more likely to develop this condition if:  You have genes that add to your risk.  You are sexually active.  You take antibiotic medicines.  You have trouble peeing because of: ? A prostate that is bigger than normal, if you are female. ? A blockage in the part of your body that drains pee from the bladder. ? A kidney stone. ? A nerve condition that affects your bladder. ? Not getting enough to drink. ? Not peeing often enough.  You have other conditions, such as: ? Diabetes. ? A weak disease-fighting system (immune system). ? Sickle cell disease. ? Gout. ? Injury of the spine. What are the signs or symptoms? Symptoms of this condition include:  Needing to pee right away.  Peeing small amounts often.  Pain or burning when peeing.  Blood in the pee.  Pee that smells bad or not like normal.  Trouble peeing.  Pee that is cloudy.  Fluid coming from the vagina, if you are female.  Pain in the belly or lower back. Other symptoms include:  Vomiting.  Not  feeling hungry.  Feeling mixed up (confused). This may be the first symptom in older adults.  Being tired and grouchy (irritable).  A fever.  Watery poop (diarrhea). How is this treated?  Taking antibiotic medicine.  Taking other medicines.  Drinking enough water. In some cases, you may need to see a specialist. Follow these instructions at home: Medicines  Take over-the-counter and prescription medicines only as told by your doctor.  If you were prescribed an antibiotic medicine, take it as told by your doctor. Do not stop taking it even if you start to feel better. General instructions  Make sure you: ? Pee until your bladder is empty. ? Do not hold pee for a long time. ? Empty your bladder after sex. ? Wipe from front to back after peeing or pooping if you are a female. Use each tissue one time when you wipe.  Drink enough fluid to keep your pee pale yellow.  Keep all follow-up visits.   Contact a doctor if:  You do not get better after 1-2 days.  Your symptoms go away and then come back. Get help right away if:  You have very bad back pain.  You have very bad pain in your lower belly.  You have a fever.  You have chills.  You feeling like you will vomit or you vomit. Summary  A urinary tract infection (UTI) is an infection of any part of the urinary tract.  This condition is caused by   germs in your genital area.  There are many risk factors for a UTI.  Treatment includes antibiotic medicines.  Drink enough fluid to keep your pee pale yellow. This information is not intended to replace advice given to you by your health care provider. Make sure you discuss any questions you have with your health care provider. Document Revised: 09/21/2019 Document Reviewed: 09/21/2019 Elsevier Patient Education  2021 Elsevier Inc.  

## 2020-06-18 NOTE — Addendum Note (Signed)
Addended by: Kavin Leech E on: 06/18/2020 09:29 AM   Modules accepted: Orders

## 2020-06-18 NOTE — Progress Notes (Signed)
MyChart Video Visit    Virtual Visit via Video Note   This visit type was conducted due to national recommendations for restrictions regarding the COVID-19 Pandemic (e.g. social distancing) in an effort to limit this patient's exposure and mitigate transmission in our community. This patient is at least at moderate risk for complications without adequate follow up. This format is felt to be most appropriate for this patient at this time. Physical exam was limited by quality of the video and audio technology used for the visit.   Patient location: Home no family members present Provider location: Work office  I discussed the limitations of evaluation and management by telemedicine and the availability of in person appointments. The patient expressed understanding and agreed to proceed.  Patient: Amanda [name]   DOB: October 29, 1990   30 y.o. Female  MRN: [id number] Visit Date: 06/18/2020  Today's healthcare provider: Azalee Course Jemiah Cuadra, FNP   Chief Complaint  Patient presents with  . Urinary Frequency   Subjective    Urinary Frequency  This is a new problem. The current episode started 1 to 4 weeks ago. The problem has been unchanged. There has been no fever. Associated symptoms include frequency, nausea and urgency. Pertinent negatives include no chills, hematuria or vomiting. She has tried nothing for the symptoms.    Has  A history of frequent UTIs   Patient Active Problem List   Diagnosis Date Noted  . URI, acute 01/07/2020  . Mitral valve insufficiency 04/24/2019  . Chest pain of uncertain etiology 04/24/2019  . Vitamin D deficiency 04/28/2016  . Laryngopharyngeal reflux 03/18/2016  . 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 Fatigue 12/31/2013  . Morbid obesity (HCC) 07/28/2012  . Tobacco use disorder 07/28/2012   Past Medical History:  Diagnosis Date  . ADD (attention deficit disorder)   . Allergy   . Asthma   . Blood transfusion without reported diagnosis   . Heart murmur    . Hypertension   . Obesity    Social History   Tobacco Use  . Smoking status: Former Smoker    Packs/day: 0.04    Years: 8.00    Pack years: 0.32    Types: Cigarettes    Quit date: 08/06/2016    Years since quitting: 3.8  . Smokeless tobacco: Never Used  Vaping Use  . Vaping Use: Never used  Substance Use Topics  . Alcohol use: No  . Drug use: No   Allergies  Allergen Reactions  . Doxycycline Nausea And Vomiting    Medications: Outpatient Medications Prior to Visit  Medication Sig  . albuterol (VENTOLIN HFA) 108 (90 Base) MCG/ACT inhaler Inhale 1-2 puffs into the lungs every 4 (four) hours as needed for wheezing or shortness of breath.  . ARIPiprazole (ABILIFY) 2 MG tablet Take 2 mg by mouth daily.  Marland Kitchen atomoxetine (STRATTERA) 40 MG capsule Take 1 capsule by mouth daily.  Marland Kitchen buPROPion (WELLBUTRIN SR) 150 MG 12 hr tablet Take 150 mg by mouth every morning.  Marland Kitchen buPROPion (WELLBUTRIN SR) 200 MG 12 hr tablet Take 200 mg by mouth daily.  Marland Kitchen ibuprofen (ADVIL) 600 MG tablet Take by mouth every 8 (eight) hours as needed.  Marland Kitchen levocetirizine (XYZAL) 5 MG tablet TAKE 1 TABLET(5 MG) BY MOUTH EVERY EVENING  . omeprazole (PRILOSEC) 40 MG capsule Take 1 capsule (40 mg total) by mouth daily.  Marland Kitchen levonorgestrel (MIRENA) 20 MCG/24HR IUD 1 Intra Uterine Device (1 each total) by Intrauterine route once for 1 dose.  No facility-administered medications prior to visit.    Review of Systems  Constitutional: Negative.  Negative for chills.  Gastrointestinal: Positive for nausea. Negative for abdominal distention, abdominal pain, anal bleeding, blood in stool, constipation, diarrhea, rectal pain and vomiting.  Genitourinary: Positive for frequency and urgency. Negative for difficulty urinating, dysuria, hematuria, vaginal bleeding, vaginal discharge and vaginal pain.  Musculoskeletal: Negative for back pain.  Neurological: Negative for dizziness, light-headedness and headaches.      Objective     There were no vitals taken for this visit.   .Constitutional:      General: She is not in acute distress.    Appearance: Normal appearance. She is not ill-appearing.   Pulmonary:     Effort: Pulmonary effort is normal. No respiratory distress.  Neurological:     Mental Status: She is alert and oriented to person, place, and time.  Psychiatric:        Mood and Affect: Mood normal.        Behavior: Behavior normal.     Assessment & Plan     Problem List Items Addressed This Visit   None   Visit Diagnoses    Urinary frequency    -  Primary   Relevant Medications   sulfamethoxazole-trimethoprim (BACTRIM DS) 800-160 MG tablet   Other Relevant Orders   POCT Urinalysis Dip Manual   Urine Microscopic   POCT Wet + KOH Prep   Urine Culture      ASSESSMENT: UTI uncomplicated without evidence of pyelonephritis  PLAN: Treatment per orders - also push fluids, may use Pyridium OTC prn. Call or return to clinic prn if these symptoms worsen or fail to improve as anticipated.  Return if symptoms worsen or fail to improve.     I discussed the assessment and treatment plan with the patient. The patient was provided an opportunity to ask questions and all were answered. The patient agreed with the plan and demonstrated an understanding of the instructions.   The patient was advised to call back or seek an in-person evaluation if the symptoms worsen or if the condition fails to improve as anticipated.    Azalee Course Lyric Hoar, FNP [facility] [phone] (phone) [phone] (fax)  [facility]

## 2020-07-08 ENCOUNTER — Telehealth: Payer: Self-pay

## 2020-07-08 NOTE — Telephone Encounter (Signed)
Will fu with referral dept.

## 2020-07-08 NOTE — Telephone Encounter (Signed)
Copied from CRM [id number]. Topic: Referral - Request for Referral >> Jul 08, 2020  9:50 AM Gwenlyn Fudge wrote: Has patient seen PCP for this complaint? Yes.   *If NO, is insurance requiring patient see PCP for this issue before PCP can refer them? Referral for which specialty: Sleep Study  Preferred provider/office: N/A Reason for referral: Pt states that she discussed this with PCP at last appt. She states that she is having sleep apnea issues and is requesting to have referral placed. Please advise.

## 2020-07-10 NOTE — Telephone Encounter (Signed)
Sent message to referral coordinator.  

## 2020-07-11 NOTE — Telephone Encounter (Signed)
Referral faxed to [facility]

## 2020-07-22 ENCOUNTER — Ambulatory Visit: Payer: Medicaid Other | Admitting: Podiatry

## 2020-07-22 ENCOUNTER — Other Ambulatory Visit: Payer: Self-pay

## 2020-07-22 ENCOUNTER — Ambulatory Visit (INDEPENDENT_AMBULATORY_CARE_PROVIDER_SITE_OTHER): Payer: Medicaid Other

## 2020-07-22 DIAGNOSIS — M722 Plantar fascial fibromatosis: Secondary | ICD-10-CM

## 2020-07-22 MED ORDER — MELOXICAM 15 MG PO TABS
15.0000 mg | ORAL_TABLET | Freq: Every day | ORAL | 1 refills | Status: DC
Start: 2020-07-22 — End: 2021-01-13

## 2020-07-22 MED ORDER — BETAMETHASONE SOD PHOS & ACET 6 (3-3) MG/ML IJ SUSP
3.0000 mg | Freq: Once | INTRAMUSCULAR | Status: AC
  Administered 2020-07-22: 3 mg via INTRA_ARTICULAR

## 2020-07-22 MED ORDER — METHYLPREDNISOLONE 4 MG PO TBPK: ORAL_TABLET | ORAL | 0 refills | Status: DC

## 2020-07-22 NOTE — Progress Notes (Signed)
   Subjective: 30 y.o. female presenting for right heel pain that's been going on for approximately 6 months. She states that she has been dealing with this heel pain for about 6 months now.  The pain began when she began working 12-hour shifts at [employer].  She no longer works there.  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  She has not done anything for treatment.   Past Medical History:  Diagnosis Date  . ADD (attention deficit disorder)   . Allergy   . Asthma   . Blood transfusion without reported diagnosis   . Heart murmur   . Hypertension   . Obesity      Objective: Physical Exam General: The patient is alert and oriented x3 in no acute distress.  Dermatology: Skin is warm, dry and supple bilateral lower extremities. Negative for open lesions or macerations bilateral.   Vascular: Dorsalis Pedis and Posterior Tibial pulses palpable bilateral.  Capillary fill time is immediate to all digits.  Neurological: Epicritic and protective threshold intact bilateral.   Musculoskeletal: Tenderness to palpation to the plantar aspect of the right heel along the plantar fascia. All other joints range of motion within normal limits bilateral. Strength 5/5 in all groups bilateral.   Radiographic exam: Normal osseous mineralization. Joint spaces preserved. No fracture/dislocation/boney destruction. No other soft tissue abnormalities or radiopaque foreign bodies.   Assessment: 1. Plantar fasciitis right  Plan of Care:  1. Patient evaluated. Xrays reviewed.   2. Injection of 0.5cc Celestone soluspan injected into the right plantar fascia  3. Rx for Medrol Dose Pack placed 4. Rx for Meloxicam ordered for patient. 5. Plantar fascial band(s) dispensed 6. Instructed patient regarding therapies and modalities at home to alleviate symptoms.  7. Return to clinic in 4 weeks.    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 Felecia Shelling, DPM     [address] [place], [place] [address]                Office [phone]  Fax [phone]

## 2020-07-25 ENCOUNTER — Telehealth: Payer: Self-pay

## 2020-07-25 NOTE — Telephone Encounter (Signed)
Copied from CRM [id number]. Topic: Referral - Request for Referral >> Jul 25, 2020  2:06 PM Gaetana Michaelis A wrote: Has patient seen PCP for this complaint? no  *If NO, is insurance requiring patient see PCP for this issue before PCP can refer them?  Referral for which specialty: sleep specialists   Preferred provider/office: patient has no preference   Reason for referral: patient would like to have a sleep study

## 2020-07-28 ENCOUNTER — Ambulatory Visit: Payer: Self-pay

## 2020-07-28 NOTE — Telephone Encounter (Signed)
Patient called with complaint of dizziness starting last night. Patient also has reduced appetitie, increased urination, and 1 incidence of diarrhea.  Pt has no idea of what may be causing this. Pt has history of UTIs.  Per protocol pt should be seen in 3 days. No appointments available until 6/21. Called office and unable to fit her in. Office suggests pt use UC.  Reason for Disposition . [1] MODERATE dizziness (e.g., interferes with normal activities) AND [2] has been evaluated by physician for this  Answer Assessment - Initial Assessment Questions 1. DESCRIPTION: "Describe your dizziness."     Dizzy 2. LIGHTHEADED: "Do you feel lightheaded?" (e.g., somewhat faint, woozy, weak upon standing)     no 3. VERTIGO: "Do you feel like either you or the room is spinning or tilting?" (i.e. vertigo)     no 4. SEVERITY: "How bad is it?"  "Do you feel like you are going to faint?" "Can you stand and walk?"   - MILD: Feels slightly dizzy, but walking normally.   - MODERATE: Feels unsteady when walking, but not falling; interferes with normal activities (e.g., school, work).   - SEVERE: Unable to walk without falling, or requires assistance to walk without falling; feels like passing out now.      Mild to moderate 5. ONSET:  "When did the dizziness begin?"     ;ast night 6. AGGRAVATING FACTORS: "Does anything make it worse?" (e.g., standing, change in head position)    standing 7. HEART RATE: "Can you tell me your heart rate?" "How many beats in 15 seconds?"  (Note: not all patients can do this)       Unable to locate pulse. 8. CAUSE: "What do you think is causing the dizziness?"     Unknown - maybe UTI 9. RECURRENT SYMPTOM: "Have you had dizziness before?" If Yes, ask: "When was the last time?" "What happened that time?"     no 10. OTHER SYMPTOMS: "Do you have any other symptoms?" (e.g., fever, chest pain, vomiting, diarrhea, bleeding)       1 diarrhea incident. Night sweats last night increased  urination and no appetite 11. PREGNANCY: "Is there any chance you are pregnant?" "When was your last menstrual period?"       Has a Mirena.  Protocols used: DIZZINESS [facility]

## 2020-08-11 ENCOUNTER — Other Ambulatory Visit: Payer: Self-pay

## 2020-08-11 ENCOUNTER — Telehealth (INDEPENDENT_AMBULATORY_CARE_PROVIDER_SITE_OTHER): Payer: Medicaid Other | Admitting: Family Medicine

## 2020-08-11 ENCOUNTER — Encounter: Payer: Self-pay | Admitting: Family Medicine

## 2020-08-11 DIAGNOSIS — R0683 Snoring: Secondary | ICD-10-CM

## 2020-08-11 DIAGNOSIS — R400 Somnolence: Secondary | ICD-10-CM | POA: Diagnosis not present

## 2020-08-11 DIAGNOSIS — R5382 Chronic fatigue, unspecified: Secondary | ICD-10-CM

## 2020-08-11 DIAGNOSIS — R21 Rash and other nonspecific skin eruption: Secondary | ICD-10-CM

## 2020-08-11 DIAGNOSIS — Z6841 Body Mass Index (BMI) 40.0 and over, adult: Secondary | ICD-10-CM

## 2020-08-11 NOTE — Progress Notes (Signed)
MyChart Video Visit    Virtual Visit via Video Note   This visit type was conducted due to national recommendations for restrictions regarding the COVID-19 Pandemic (e.g. social distancing) in an effort to limit this patient's exposure and mitigate transmission in our community. This patient is at least at moderate risk for complications without adequate follow up. This format is felt to be most appropriate for this patient at this time. Physical exam was limited by quality of the video and audio technology used for the visit.   Patient location: home Provider location: office  I discussed the limitations of evaluation and management by telemedicine and the availability of in person appointments. The patient expressed understanding and agreed to proceed.  Patient: Amanda [name]   DOB: [birth year]   29 y.o. Female  MRN: [id number] Visit Date: 08/11/2020  Today's healthcare provider: Dortha Kern, PA-C   No chief complaint on file.  Subjective    HPI  Patient is a 30 year old female who presents via video visit to discuss sleep apnea.  She was seen by Daiva Nakayama, PA in March and order was placed but patient never heard from anyone about an getting it set up.   Patient complains of worsening daytime somnolence and fatigue. She also admits to snoring.  Patient also complains of having some bumps on her forehead that se would like to go to a dermatologist for evaluation.       Past Medical History:  Diagnosis Date   ADD (attention deficit disorder)    Allergy    Asthma    Blood transfusion without reported diagnosis    Heart murmur    Hypertension    Obesity    Past Surgical History:  Procedure Laterality Date   FOOT SURGERY     INTRAUTERINE DEVICE (IUD) INSERTION  10/2018   INTRAUTERINE DEVICE INSERTION     Social History   Tobacco Use   Smoking status: Former    Packs/day: 0.04    Years: 8.00    Pack years: 0.32    Types: Cigarettes    Quit date: 08/06/2016     Years since quitting: 4.0   Smokeless tobacco: Never  Vaping Use   Vaping Use: Never used  Substance Use Topics   Alcohol use: No   Drug use: No   Family Status  Relation Name Status   Oceanographer  (Not Specified)   Mother  Alive   Father  Alive   Brother  Alive   Brother  Alive   Sister  (Not Specified)   MGF  (Not Specified)   Allergies  Allergen Reactions   Doxycycline Nausea And Vomiting      Medications: Outpatient Medications Prior to Visit  Medication Sig   albuterol (VENTOLIN HFA) 108 (90 Base) MCG/ACT inhaler Inhale 1-2 puffs into the lungs every 4 (four) hours as needed for wheezing or shortness of breath.   ARIPiprazole (ABILIFY) 2 MG tablet Take 2 mg by mouth daily.   atomoxetine (STRATTERA) 40 MG capsule Take 1 capsule by mouth daily.   buPROPion (WELLBUTRIN SR) 150 MG 12 hr tablet Take 150 mg by mouth every morning.   buPROPion (WELLBUTRIN SR) 200 MG 12 hr tablet Take 200 mg by mouth daily.   ibuprofen (ADVIL) 600 MG tablet Take by mouth every 8 (eight) hours as needed.   levocetirizine (XYZAL) 5 MG tablet TAKE 1 TABLET(5 MG) BY MOUTH EVERY EVENING   levonorgestrel (MIRENA) 20 MCG/24HR IUD 1 Intra Uterine  Device (1 each total) by Intrauterine route once for 1 dose.   meloxicam (MOBIC) 15 MG tablet Take 1 tablet (15 mg total) by mouth daily.   methylPREDNISolone (MEDROL DOSEPAK) 4 MG TBPK tablet 6 day dose pack - take as directed   omeprazole (PRILOSEC) 40 MG capsule Take 1 capsule (40 mg total) by mouth daily.   No facility-administered medications prior to visit.    Review of Systems  Respiratory:  Negative for cough and shortness of breath.   Cardiovascular:  Negative for chest pain.  Psychiatric/Behavioral:  Positive for sleep disturbance.    Last CBC Lab Results  Component Value Date   WBC 3.7 (L) 09/29/2019   HGB 12.7 09/29/2019   HCT 38.4 09/29/2019   MCV 90.4 09/29/2019   MCH 29.9 09/29/2019   RDW 13.1 09/29/2019   PLT 202 09/29/2019    Last metabolic panel Lab Results  Component Value Date   GLUCOSE 100 (H) 09/29/2019   NA 136 09/29/2019   K 3.4 (L) 09/29/2019   CL 107 09/29/2019   CO2 24 09/29/2019   BUN 9 09/29/2019   CREATININE 0.82 09/29/2019   GFRNONAA >60 09/29/2019   GFRAA >60 09/29/2019   CALCIUM 8.2 (L) 09/29/2019   PROT 7.0 10/17/2017   ALBUMIN 3.6 10/17/2017   LABGLOB 3.0 06/03/2017   AGRATIO 1.3 06/03/2017   BILITOT 0.8 10/17/2017   ALKPHOS 46 10/17/2017   AST 15 10/17/2017   ALT 18 10/17/2017   ANIONGAP 5 09/29/2019   Last hemoglobin A1c Lab Results  Component Value Date   HGBA1C 5.4 06/03/2017      Objective    There were no vitals taken for this visit. BP Readings from Last 3 Encounters:  10/01/19 (!) 123/94  09/29/19 105/75  08/10/19 105/70   Wt Readings from Last 3 Encounters:  09/29/19 300 lb (136.1 kg)  08/10/19 (!) 303 lb (137.4 kg)  05/21/19 (!) 302 lb (137 kg)      Physical Exam WDWN femals in no apparent distress.  Head: Normocephalic, atraumatic. Neck: Supple, NROM Respiratory: No apparent distress Psych: Normal mood and affect     Assessment & Plan     1. Snoring Significant snoring with some episodes of apnea witnessed by boyfriend. Need sleep study. - Home sleep test  2. Daytime somnolence Unable to read, finish a movie, drive or ride more than 15 minutes without falling asleep. Get sleep study. - Home sleep test  3. Chronic fatigue Unchanged. Difficulty getting sleep study in March 2022. Reorder sleep study. - Home sleep test  4. Class 3 severe obesity due to excess calories with serious comorbidity and body mass index (BMI) of 40.0 to 44.9 in adult [facility]) Needs to lose weight and get sleep study. - Home sleep test  5. Papular rash Has treated papules as if it was acne without improvement from Retin-A. Requests dermatology referral. - Ambulatory referral to Dermatology   No follow-ups on file.     I discussed the assessment and treatment  plan with the patient. The patient was provided an opportunity to ask questions and all were answered. The patient agreed with the plan and demonstrated an understanding of the instructions.   The patient was advised to call back or seek an in-person evaluation if the symptoms worsen or if the condition fails to improve as anticipated.  I provided 20 minutes of non-face-to-face time during this encounter.  Sherril Croon, PA-C [facility] [phone] (phone) [phone] (fax)  [facility]

## 2020-08-19 ENCOUNTER — Ambulatory Visit: Payer: Medicaid Other | Admitting: Podiatry

## 2020-08-27 ENCOUNTER — Telehealth: Payer: Self-pay

## 2020-08-27 NOTE — Telephone Encounter (Signed)
Have not seen any report yet. Check with [facility] where test was done.

## 2020-08-27 NOTE — Telephone Encounter (Signed)
Copied from CRM [id number]. Topic: General - Other >> Aug 27, 2020  2:44 PM Gwenlyn Fudge wrote: Reason for CRM: Pt called stating that she had a sleep study done 2 weeks ago and is requesting to have her results. Please advise.

## 2020-09-15 NOTE — Telephone Encounter (Signed)
Pt called to find out status of her sleep study results/ pt stated she hasnt heard anything from the study yet/ please advise

## 2020-09-15 NOTE — Telephone Encounter (Signed)
No report yet. Please check with [facility] to see when we might get a report.

## 2020-09-16 ENCOUNTER — Other Ambulatory Visit: Payer: Self-pay

## 2020-09-16 ENCOUNTER — Ambulatory Visit (INDEPENDENT_AMBULATORY_CARE_PROVIDER_SITE_OTHER): Payer: Medicaid Other | Admitting: Podiatry

## 2020-09-16 DIAGNOSIS — M722 Plantar fascial fibromatosis: Secondary | ICD-10-CM

## 2020-09-16 MED ORDER — BETAMETHASONE SOD PHOS & ACET 6 (3-3) MG/ML IJ SUSP
3.0000 mg | Freq: Once | INTRAMUSCULAR | Status: AC
  Administered 2020-09-16: 3 mg via INTRA_ARTICULAR

## 2020-09-16 NOTE — Progress Notes (Signed)
   Subjective: 30 y.o. female presenting for follow-up evaluation of RT plantar fasciitis. 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 She presents for further treatment and evaluation.   Past Medical History:  Diagnosis Date   ADD (attention deficit disorder)    Allergy    Asthma    Blood transfusion without reported diagnosis    Heart murmur    Hypertension    Obesity    Objective: Physical Exam General: The patient is alert and oriented x3 in no acute distress.  Dermatology: Skin is warm, dry and supple bilateral lower extremities. Negative for open lesions or macerations bilateral.   Vascular: Dorsalis Pedis and Posterior Tibial pulses palpable bilateral.  Capillary fill time is immediate to all digits.  Neurological: Epicritic and protective threshold intact bilateral.   Musculoskeletal: Tenderness to palpation to the plantar aspect of the right heel along the plantar fascia. All other joints range of motion within normal limits bilateral. Strength 5/5 in all groups bilateral.   Assessment: 1. Plantar fasciitis right  Plan of Care:  1. Patient evaluated. Xrays reviewed.   2. Injection of 0.5cc Celestone soluspan injected into the right plantar fascia  3. OTC powerstep insoles provided for the patient 4. Continue meloxicam 15mg  daily 5. Continue plantar fascial brace prn 6. 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 Now working for on [employer], DPM [facility]  Dr. Janne Lab, DPM    [address] [place], [place]                Office [phone]  Fax [phone]

## 2020-09-26 ENCOUNTER — Other Ambulatory Visit: Payer: Self-pay

## 2020-10-07 NOTE — Telephone Encounter (Signed)
Called patient and advised her that we have the results to her sleep study. Per Dr. Leonard Schwartz, she will need to come in and discuss and go over results and determine next steps. Patient's phone was disconnected and attempted to call pt back 2x and it was answered and disconnected each time.  Unable to contact the patient. Will send results to medical records to scan into patient's chart.

## 2020-10-07 NOTE — Telephone Encounter (Signed)
Pt called about update on sleep study / please advise if report was received

## 2020-10-09 ENCOUNTER — Ambulatory Visit: Payer: Medicaid Other

## 2020-10-10 ENCOUNTER — Ambulatory Visit: Payer: Medicaid Other | Admitting: Obstetrics and Gynecology

## 2020-10-16 ENCOUNTER — Other Ambulatory Visit: Payer: Self-pay | Admitting: Family Medicine

## 2020-10-16 ENCOUNTER — Telehealth: Payer: Self-pay

## 2020-10-16 DIAGNOSIS — G4733 Obstructive sleep apnea (adult) (pediatric): Secondary | ICD-10-CM

## 2020-10-16 NOTE — Telephone Encounter (Signed)
Copied from CRM [id number]. Topic: General - Other >> Oct 16, 2020 11:03 AM Glean Salen wrote: Reason for LID:CVUDTHY called back to get results of slep study done 2 months ago. Please call back

## 2020-10-16 NOTE — Telephone Encounter (Signed)
Proceed with auto-titration CPAP for DX: OSA.

## 2020-10-21 DIAGNOSIS — G4733 Obstructive sleep apnea (adult) (pediatric): Secondary | ICD-10-CM | POA: Insufficient documentation

## 2020-10-21 NOTE — Telephone Encounter (Signed)
Patient advised and verbalized understanding. Patient agrees to proceed with trying CPAP. Order has been written and placed on [employer] for signature.

## 2020-11-17 ENCOUNTER — Ambulatory Visit: Payer: Self-pay | Admitting: *Deleted

## 2020-11-17 NOTE — Telephone Encounter (Signed)
Reason for Disposition  [1] Sore throat with cough/cold symptoms AND [2] present > 5 days  Answer Assessment - Initial Assessment Questions 1. ONSET: "When did the throat start hurting?" (Hours or days ago)      I've had a sore throat for 8 days now.   It started out scratchy now my right tonsil is swollen.   I'm congested and sneezing.   No fever.   My daughter has a sore throat too.    No white spots.    No body aches and chills.   My right tonsil has been swollen for 6-7 days now.    The right side of my throat is sore.   No soreness under her jaw or around her ear. 2. SEVERITY: "How bad is the sore throat?" (Scale 1-10; mild, moderate or severe)   - MILD (1-3):  doesn't interfere with eating or normal activities   - MODERATE (4-7): interferes with eating some solids and normal activities   - SEVERE (8-10):  excruciating pain, interferes with most normal activities   - SEVERE DYSPHAGIA: can't swallow liquids, drooling     Moderate 3. STREP EXPOSURE: "Has there been any exposure to strep within the past week?" If Yes, ask: "What type of contact occurred?"      Not that I know of 4.  VIRAL SYMPTOMS: "Are there any symptoms of a cold, such as a runny nose, cough, hoarse voice or red eyes?"      Runny nose, stomach cramps but no diarrhea. 5. FEVER: "Do you have a fever?" If Yes, ask: "What is your temperature, how was it measured, and when did it start?"     No 6. PUS ON THE TONSILS: "Is there pus on the tonsils in the back of your throat?"     No 7. OTHER SYMPTOMS: "Do you have any other symptoms?" (e.g., difficulty breathing, headache, rash)     See above 8. PREGNANCY: "Is there any chance you are pregnant?" "When was your last menstrual period?"     No I have Murana  Protocols used: Sore Throat-A-AH

## 2020-11-17 NOTE — Telephone Encounter (Signed)
Pt called in c/o her right tonsil being swollen and sore for the last 6-8 days.  There isn't any white spots on it but the right side of her throat is sore.   She has nasal congestion and is sneezing more than usual.   Denies fever or body aches or chills.   Her daughter has the same symptoms.  There are no appts available at [facility] this week (I spoke with the practice earlier today) and suggested she go to the urgent care and perhaps take her daughter too.   She was agreeable to this plan.  See triage notes.

## 2020-12-14 IMAGING — CT CT ANGIO CHEST
3 of 7 series · 18 of 36 positions shown · IV contrast (Omnipaque or Isovue)
Comparison: CT chest abdomen pelvis, 10/17/2017

CLINICAL DATA: PE suspected, pleuritic chest pain, shortness of
breath, COVID

EXAM:
CT ANGIOGRAPHY CHEST WITH CONTRAST
TECHNIQUE: Multidetector CT imaging of the chest was performed using the
standard protocol during bolus administration of intravenous
contrast. Multiplanar CT image reconstructions and MIPs were
obtained to evaluate the vascular anatomy.
CONTRAST:  75mL OMNIPAQUE IOHEXOL 350 MG/ML SOLN

[Series 7: pe axial thins · axial · 0.98mm/px · z∈[+1058,+1279]mm · 10 of 271 slices shown (1 of 2)]
[im 25/271  lung]
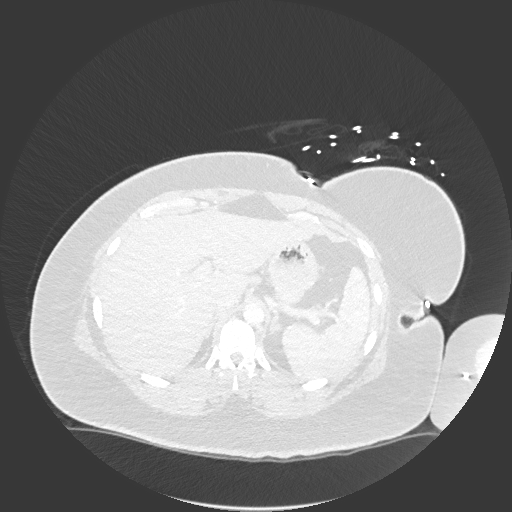
[im 50/271  mediastinal]
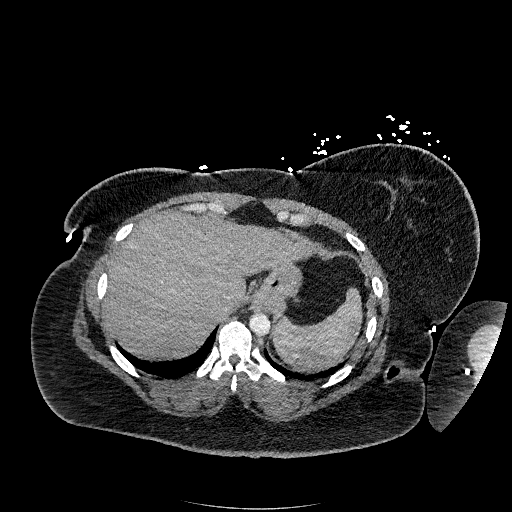
[im 74/271  lung]
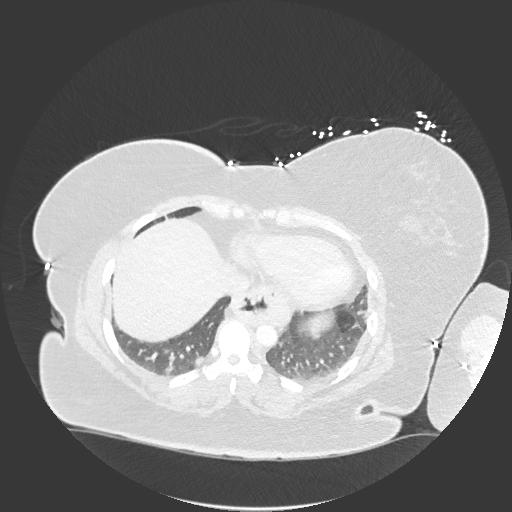
[im 99/271  mediastinal]
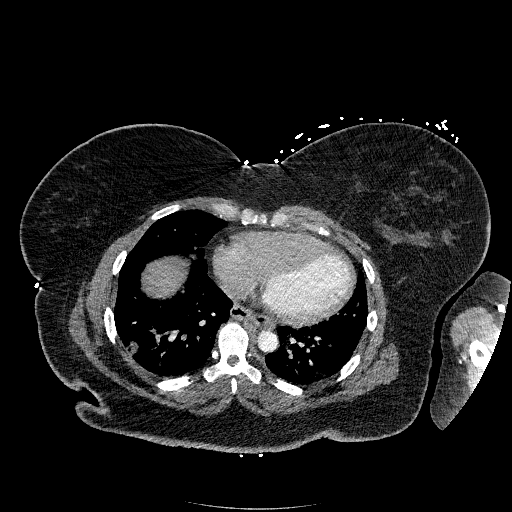
[im 123/271  lung]
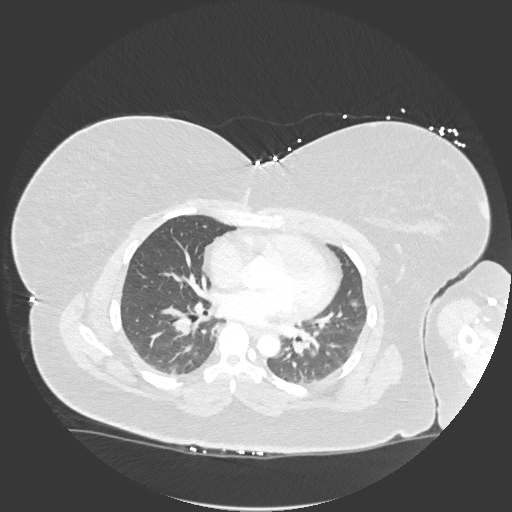
[im 148/271  mediastinal]
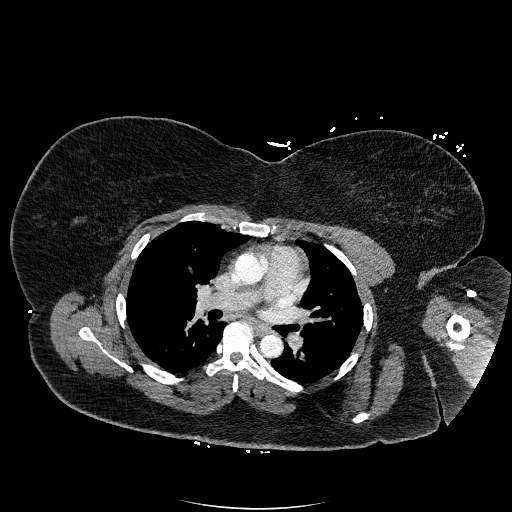
[im 172/271  lung]
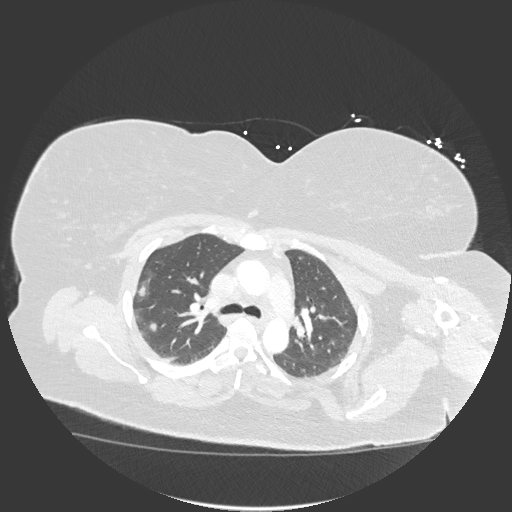
[im 197/271  mediastinal]
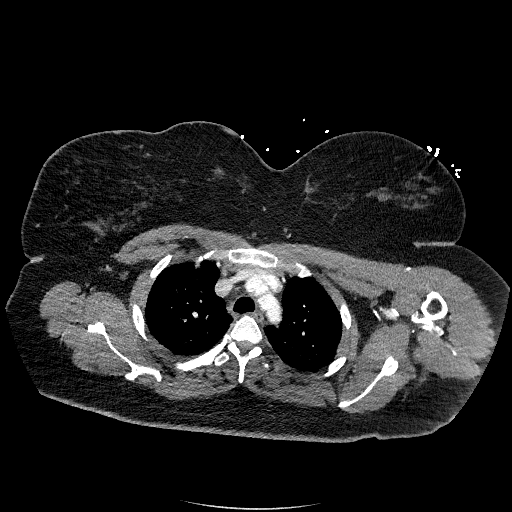
[im 221/271  lung]
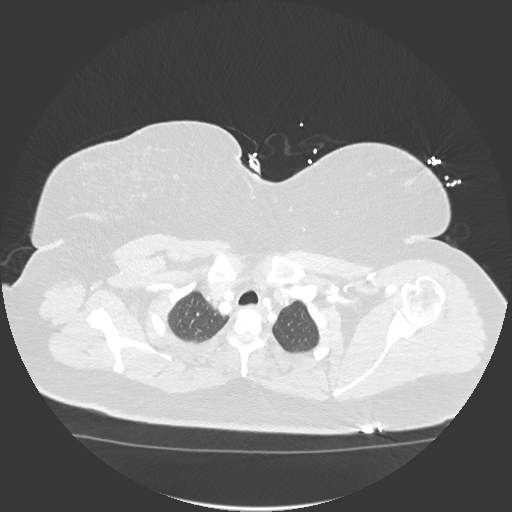
[im 246/271  mediastinal]
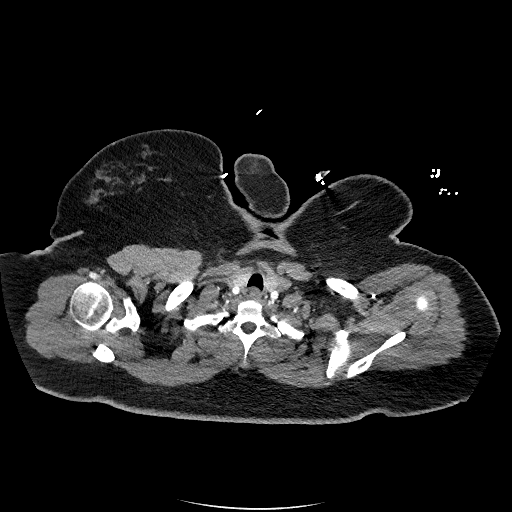

[Series 21: pe axial thins · axial · 0.98mm/px · z∈[+1038,+1254]mm · 7 of 297 slices shown (2 of 2)]
[im 27/297  lung]
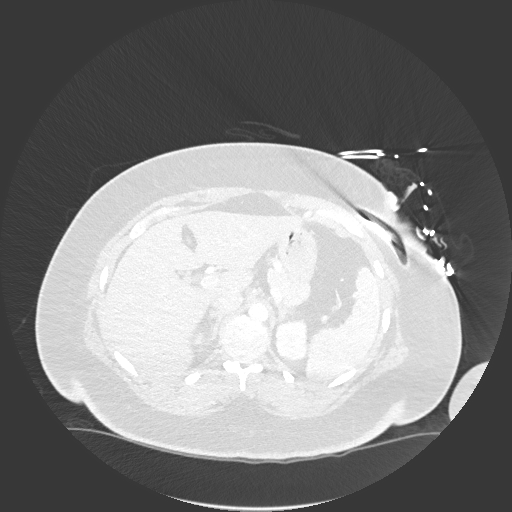
[im 54/297  lung]
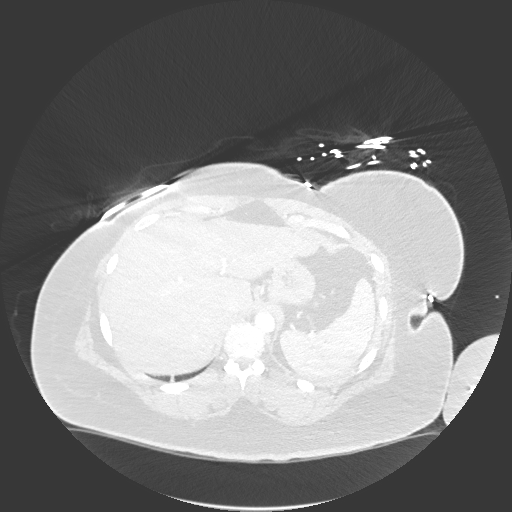
[im 108/297  lung]
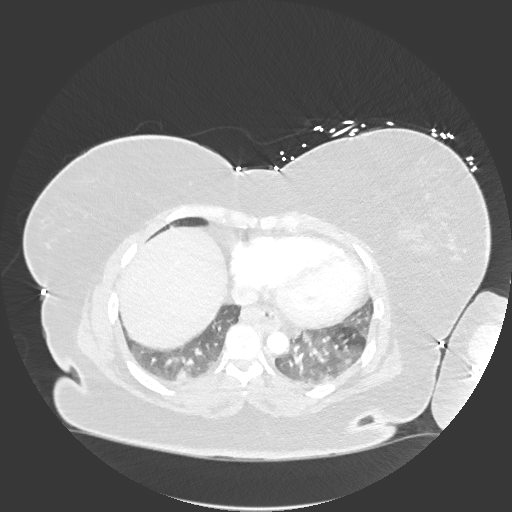
[im 135/297  lung]
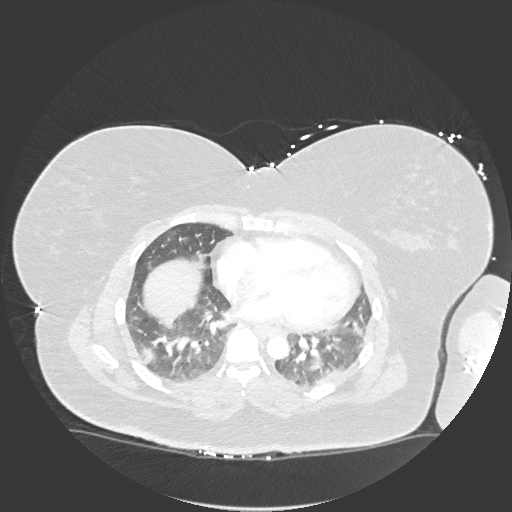
[im 162/297  lung]
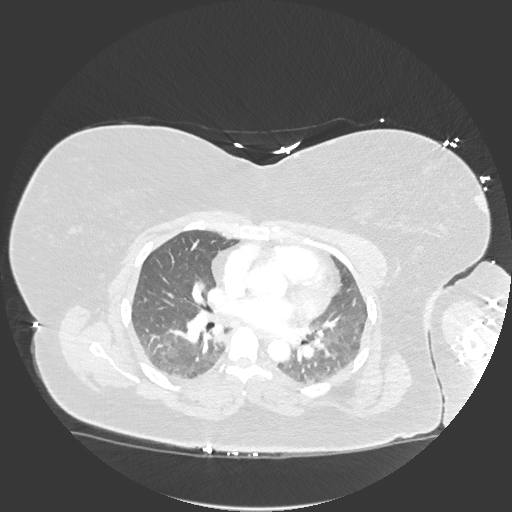
[im 189/297  lung]
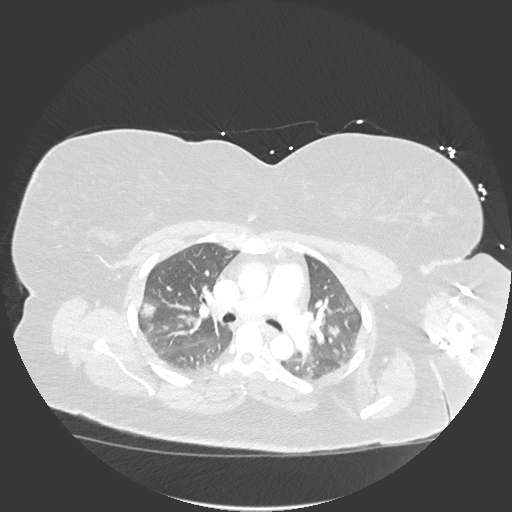
[im 243/297  lung]
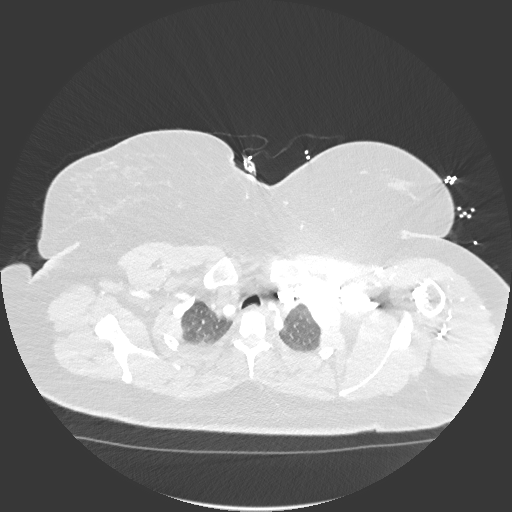

[Series 23: cor soft · coronal · 0.61mm/px · 1 of 151 slices shown]
[im 76/151  mediastinal]
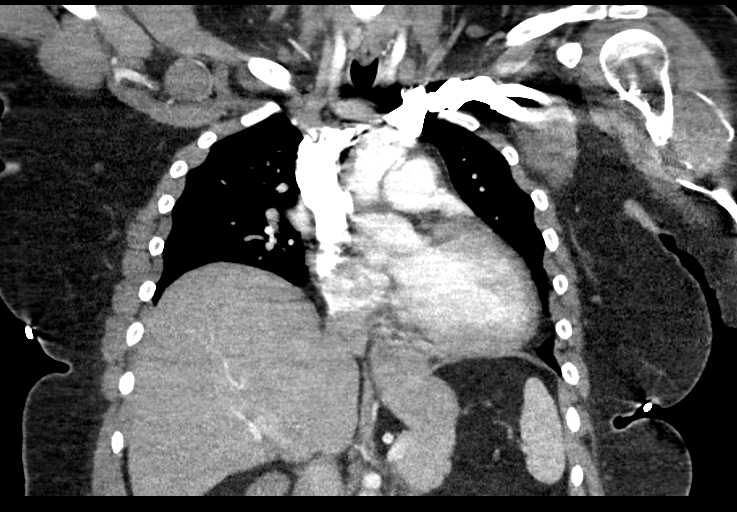

[18 of 36 positions shown; findings below may reference images not displayed]

FINDINGS: Cardiovascular: Satisfactory opacification of the pulmonary arteries
to the segmental level. No evidence of pulmonary embolism. Normal
heart size. No pericardial effusion.

Mediastinum/Nodes: No enlarged mediastinal, hilar, or axillary lymph
nodes. Moderate hiatal hernia. Thyroid gland, trachea, and esophagus
demonstrate no significant findings.

Lungs/Pleura: Heterogeneous, somewhat nodular airspace opacity
throughout the lungs, most conspicuous in the peripheral lung bases.
No pleural effusion or pneumothorax.

Upper Abdomen: No acute abnormality.

Musculoskeletal: No chest wall abnormality. No acute or significant
osseous findings.

Review of the MIP images confirms the above findings.
IMPRESSION: 1. Negative examination for pulmonary embolism.
2. Heterogeneous, somewhat nodular airspace opacity throughout the
lungs, most conspicuous in the peripheral lung bases. Findings are
in keeping with reported clinical diagnosis of COVID airspace
disease.
3. Moderate hiatal hernia.

## 2020-12-14 IMAGING — DX DG CHEST 1V PORT
1 series · 1 of 1 positions shown · non-contrast
Comparison: 04/02/2013 chest radiograph.

CLINICAL DATA: [id number] positive, worsening dyspnea

EXAM:
PORTABLE CHEST 1 VIEW

[chest ap]
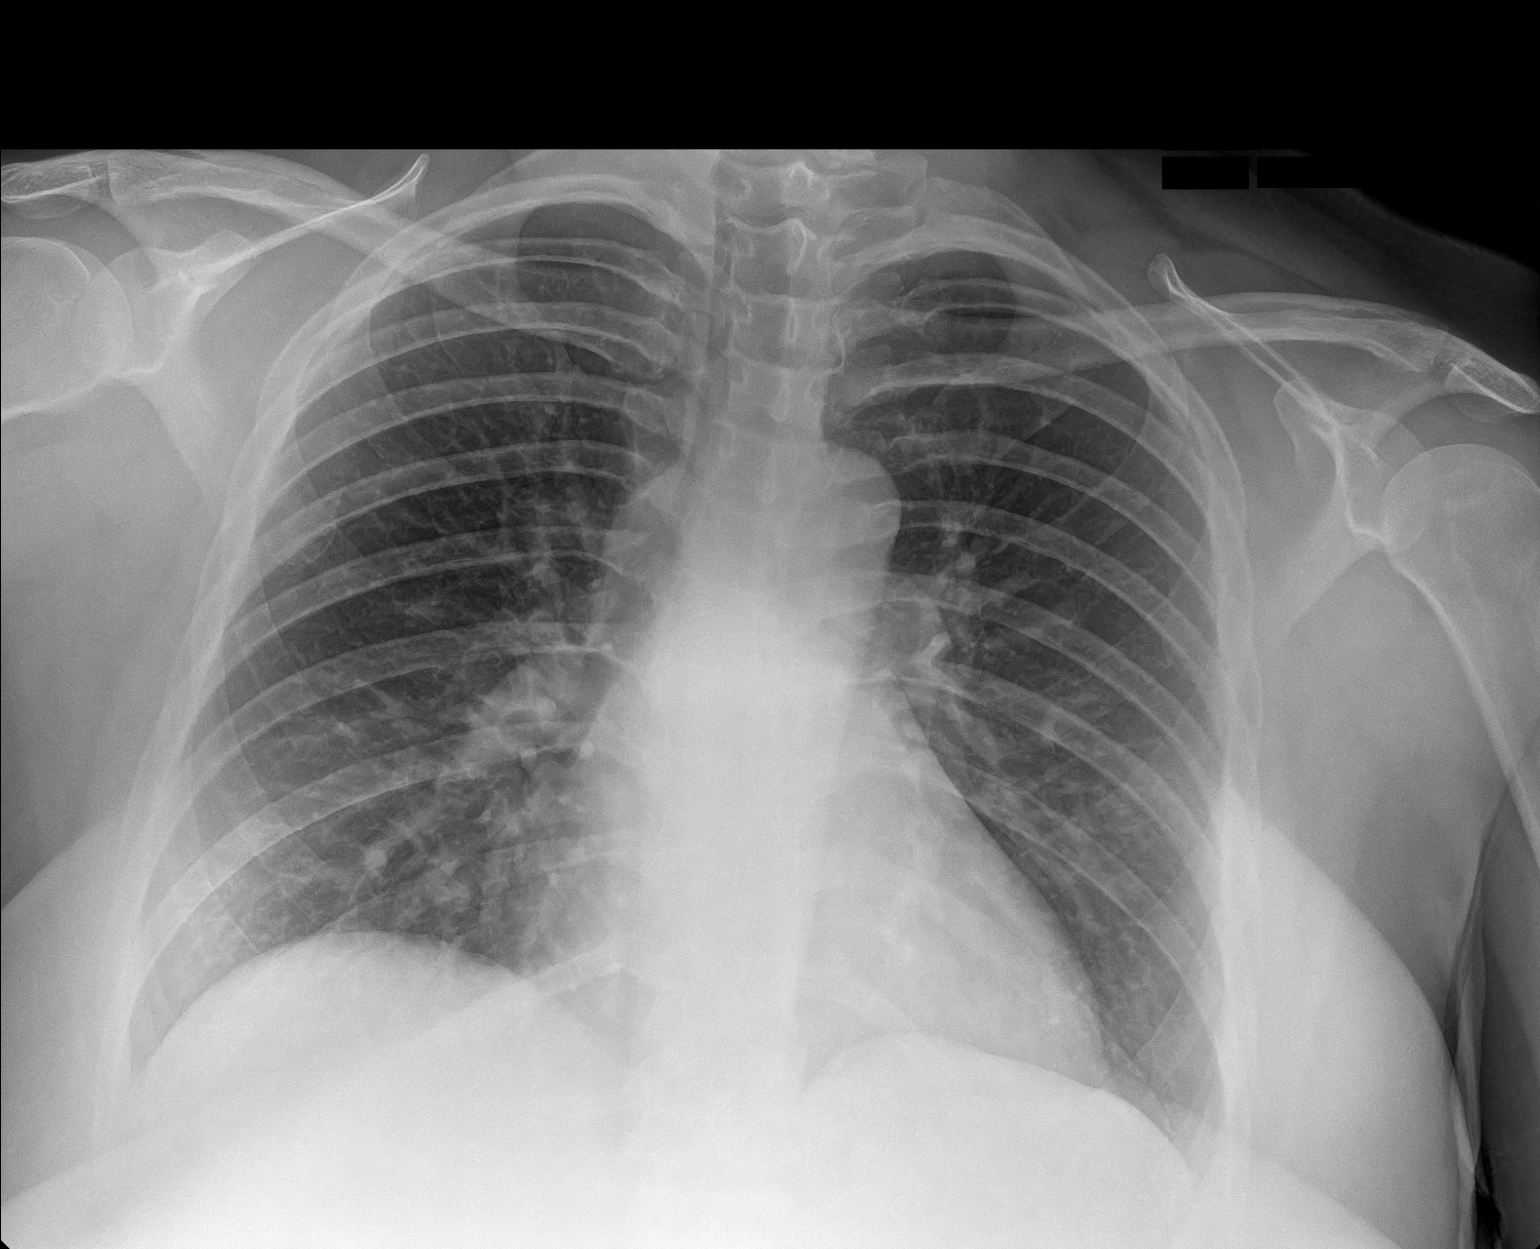

[1 of 1 positions shown; findings below may reference images not displayed]

FINDINGS: Stable cardiomediastinal silhouette with normal heart size. No
pneumothorax. No pleural effusion. Lungs appear clear, with no acute
consolidative airspace disease and no pulmonary edema.
IMPRESSION: No active disease.

## 2021-01-13 ENCOUNTER — Encounter: Payer: Self-pay | Admitting: Podiatry

## 2021-01-13 ENCOUNTER — Ambulatory Visit: Payer: Medicaid Other | Admitting: Podiatry

## 2021-01-13 ENCOUNTER — Other Ambulatory Visit: Payer: Self-pay

## 2021-01-13 DIAGNOSIS — M722 Plantar fascial fibromatosis: Secondary | ICD-10-CM | POA: Diagnosis not present

## 2021-01-13 MED ORDER — MELOXICAM 15 MG PO TABS: 15.0000 mg | ORAL_TABLET | Freq: Every day | ORAL | 1 refills | Status: DC

## 2021-01-13 MED ORDER — METHYLPREDNISOLONE 4 MG PO TBPK: ORAL_TABLET | ORAL | 0 refills | Status: DC

## 2021-01-20 ENCOUNTER — Ambulatory Visit: Payer: Medicaid Other | Admitting: Podiatry

## 2021-01-20 MED ORDER — BETAMETHASONE SOD PHOS & ACET 6 (3-3) MG/ML IJ SUSP
3.0000 mg | Freq: Once | INTRAMUSCULAR | Status: AC
  Administered 2021-01-20: 3 mg via INTRA_ARTICULAR

## 2021-01-20 NOTE — Addendum Note (Signed)
Addended by: Felecia Shelling on: 01/20/2021 09:03 PM   Modules accepted: Orders

## 2021-01-20 NOTE — Progress Notes (Addendum)
   Subjective: 30 y.o. female presenting for follow-up evaluation of plantar fasciitis to the right foot.  She was last seen in the office on 09/16/2020.  She says that her foot continues to hurt and it is actually gotten worse progressively.  She has had steroid injections in the past with over-the-counter arch supports and anti-inflammatories.  She was also dispensed a plantar fascial brace in the past.  She presents for further treatment and evaluation   Past Medical History:  Diagnosis Date   ADD (attention deficit disorder)    Allergy    Asthma    Blood transfusion without reported diagnosis    Heart murmur    Hypertension    Obesity      Objective: Physical Exam General: The patient is alert and oriented x3 in no acute distress.  Dermatology: Skin is warm, dry and supple bilateral lower extremities. Negative for open lesions or macerations bilateral.   Vascular: Dorsalis Pedis and Posterior Tibial pulses palpable bilateral.  Capillary fill time is immediate to all digits.  Neurological: Epicritic and protective threshold intact bilateral.   Musculoskeletal: There continues to be chronic tenderness to palpation to the plantar aspect of the right heel along the plantar fascia. All other joints range of motion within normal limits bilateral. Strength 5/5 in all groups bilateral.     Assessment: 1. Plantar fasciitis right  Plan of Care:  1. Patient evaluated. 2. Injection of 0.5cc Celestone soluspan injected into the right plantar fascia  3. Rx for Medrol Dose Pack placed 4. Rx for Meloxicam ordered for patient. 5.  Cam boot dispensed.  Weightbearing as tolerated 6. Instructed patient regarding therapies and modalities at home to alleviate symptoms.  7. Return to clinic after [facility] for follow-up evaluation.  At this time if the patient does not improve we may need to consider surgical consultation which would include endoscopic plantar fasciotomy  *Works from home.   Answers phones for Medicare   Felecia Shelling, DPM [facility]  Dr. Felecia Shelling, DPM    [address] [place], [place] [address]                Office [phone]  Fax [phone]

## 2021-01-26 NOTE — Telephone Encounter (Signed)
Mirena rcvd/charged 11/14/2018

## 2021-01-27 ENCOUNTER — Ambulatory Visit: Payer: Medicaid Other | Admitting: Dermatology

## 2021-01-27 ENCOUNTER — Other Ambulatory Visit: Payer: Self-pay

## 2021-01-27 ENCOUNTER — Encounter: Payer: Self-pay | Admitting: Dermatology

## 2021-01-27 DIAGNOSIS — L821 Other seborrheic keratosis: Secondary | ICD-10-CM

## 2021-01-27 DIAGNOSIS — L700 Acne vulgaris: Secondary | ICD-10-CM | POA: Diagnosis not present

## 2021-01-27 DIAGNOSIS — L738 Other specified follicular disorders: Secondary | ICD-10-CM | POA: Diagnosis not present

## 2021-01-27 DIAGNOSIS — L720 Epidermal cyst: Secondary | ICD-10-CM

## 2021-01-27 MED ORDER — TRETINOIN 0.025 % EX CREA
TOPICAL_CREAM | Freq: Every day | CUTANEOUS | 6 refills | Status: AC
Start: 2021-01-27 — End: 2021-02-26

## 2021-01-27 MED ORDER — TRETINOIN 0.025 % EX CREA: TOPICAL_CREAM | Freq: Every day | CUTANEOUS | 0 refills | Status: DC

## 2021-01-27 NOTE — Patient Instructions (Signed)
Topical retinoid medications like tretinoin/Retin-A, adapalene/Differin, tazarotene/Fabior, and Epiduo/Epiduo Forte can cause dryness and irritation when first started. Only apply a pea-sized amount to the entire affected area. Avoid applying it around the eyes, edges of mouth and creases at the nose. If you experience irritation, use a good moisturizer first and/or apply the medicine less often. If you are doing well with the medicine, you can increase how often you use it until you are applying every night. Be careful with sun protection while using this medication as it can make you sensitive to the sun. This medicine should not be used by pregnant women.         If You Need Anything After Your Visit  If you have any questions or concerns for your doctor, please call our main line at [phone] and press option 4 to reach your doctor's medical assistant. If no one answers, please leave a voicemail as directed and we will return your call as soon as possible. Messages left after 4 pm will be answered the following business day.   You may also send us a message via MyChart. We typically respond to MyChart messages within 1-2 business days.  For prescription refills, please ask your pharmacy to contact our office. Our fax number is [phone].  If you have an urgent issue when the clinic is closed that cannot wait until the next business day, you can page your doctor at the number below.    Please note that while we do our best to be available for urgent issues outside of office hours, we are not available 24/7.   If you have an urgent issue and are unable to reach us, you may choose to seek medical care at your doctor's office, retail clinic, urgent care center, or emergency room.  If you have a medical emergency, please immediately call 911 or go to the emergency department.  Pager Numbers  - Dr. Kowalski: [phone]  - Dr. Moye: [phone]  - Dr. Stewart: [phone]  In  the event of inclement weather, please call our main line at [phone] for an update on the status of any delays or closures.  Dermatology Medication Tips: Please keep the boxes that topical medications come in in order to help keep track of the instructions about where and how to use these. Pharmacies typically print the medication instructions only on the boxes and not directly on the medication tubes.   If your medication is too expensive, please contact our office at [phone] option 4 or send us a message through MyChart.   We are unable to tell what your co-pay for medications will be in advance as this is different depending on your insurance coverage. However, we may be able to find a substitute medication at lower cost or fill out paperwork to get insurance to cover a needed medication.   If a prior authorization is required to get your medication covered by your insurance company, please allow us 1-2 business days to complete this process.  Drug prices often vary depending on where the prescription is filled and some pharmacies may offer cheaper prices.  The website www.goodrx.com contains coupons for medications through different pharmacies. The prices here do not account for what the cost may be with help from insurance (it may be cheaper with your insurance), but the website can give you the price if you did not use any insurance.  - You can print the associated coupon and take it with your prescription to the pharmacy.  -   You may also stop by our office during regular business hours and pick up a GoodRx coupon card.  - If you need your prescription sent electronically to a different pharmacy, notify our office through [facility] MyChart or by phone at [phone] option 4.     Si Usted Necesita Algo Despus de Su Visita  Tambin puede enviarnos un mensaje a travs de MyChart. Por lo general respondemos a los mensajes de MyChart en el transcurso de 1 a 2 das  hbiles.  Para renovar recetas, por favor pida a su farmacia que se ponga en contacto con nuestra oficina. Nuestro nmero de fax es el [phone].  Si tiene un asunto urgente cuando la clnica est cerrada y que no puede esperar hasta el siguiente da hbil, puede llamar/localizar a su doctor(a) al nmero que aparece a continuacin.   Por favor, tenga en cuenta que aunque hacemos todo lo posible para estar disponibles para asuntos urgentes fuera del horario de oficina, no estamos disponibles las 24 horas del da, los 7 das de la semana.   Si tiene un problema urgente y no puede comunicarse con nosotros, puede optar por buscar atencin mdica  en el consultorio de su doctor(a), en una clnica privada, en un centro de atencin urgente o en una sala de emergencias.  Si tiene una emergencia mdica, por favor llame inmediatamente al 911 o vaya a la sala de emergencias.  Nmeros de bper  - Dr. Kowalski: [phone]  - Dra. Moye: [phone]  - Dra. Stewart: [phone]  En caso de inclemencias del tiempo, por favor llame a nuestra lnea principal al [phone] para una actualizacin sobre el estado de cualquier retraso o cierre.  Consejos para la medicacin en dermatologa: Por favor, guarde las cajas en las que vienen los medicamentos de uso tpico para ayudarle a seguir las instrucciones sobre dnde y cmo usarlos. Las farmacias generalmente imprimen las instrucciones del medicamento slo en las cajas y no directamente en los tubos del medicamento.   Si su medicamento es muy caro, por favor, pngase en contacto con nuestra oficina llamando al [phone] y presione la opcin 4 o envenos un mensaje a travs de MyChart.   No podemos decirle cul ser su copago por los medicamentos por adelantado ya que esto es diferente dependiendo de la cobertura de su seguro. Sin embargo, es posible que podamos encontrar un medicamento sustituto a menor costo o llenar un formulario para que el  seguro cubra el medicamento que se considera necesario.   Si se requiere una autorizacin previa para que su compaa de seguros cubra su medicamento, por favor permtanos de 1 a 2 das hbiles para completar este proceso.  Los precios de los medicamentos varan con frecuencia dependiendo del lugar de dnde se surte la receta y alguna farmacias pueden ofrecer precios ms baratos.  El sitio web www.goodrx.com tiene cupones para medicamentos de diferentes farmacias. Los precios aqu no tienen en cuenta lo que podra costar con la ayuda del seguro (puede ser ms barato con su seguro), pero el sitio web puede darle el precio si no utiliz ningn seguro.  - Puede imprimir el cupn correspondiente y llevarlo con su receta a la farmacia.  - Tambin puede pasar por nuestra oficina durante el horario de atencin regular y recoger una tarjeta de cupones de GoodRx.  - Si necesita que su receta se enve electrnicamente a una farmacia diferente, informe a nuestra oficina a travs de MyChart de [facility] o por telfono llamando al [phone] y   presione la opcin 4.  

## 2021-01-27 NOTE — Telephone Encounter (Signed)
Apt was 07/07/2018 per Epic apt was cancelled

## 2021-01-27 NOTE — Progress Notes (Addendum)
   New Patient Visit  Subjective  Amanda [name] is a 30 y.o. female who presents for the following: Rash (Patient here today for spots at face. Patient advises she was seen here years ago for same issue and was given a retinoid. Patient advises it did not help to get rid of any, more have come up since she was last seen. ).  The following portions of the chart were reviewed this encounter and updated as appropriate:   Tobacco  Allergies  Meds  Problems  Med Hx  Surg Hx  Fam Hx      Review of Systems:  No other skin or systemic complaints except as noted in HPI or Assessment and Plan.  Objective  Well appearing patient in no apparent distress; mood and affect are within normal limits.  A focused examination was performed including face. Relevant physical exam findings are noted in the Assessment and Plan.  Forehead Smooth white papules  Forehead Small yellow papules with a central dell.    face Trace open comedones at face   Assessment & Plan  Milia Forehead  Symptomatic, tender  Samples x 2 of Tazorac 0.05% cream given to patient to use once daily at bedtime. Lot # [id number]  Exp: 01/2021  Start tretinoin 0.025% cream nightly as tolerated for prevention  Acne/Milia surgery - Forehead Procedure risks and benefits were discussed with the patient and verbal consent was obtained. Following prep of the skin on the forehead with an alcohol swab and local anesthesia injection, extraction of milia was performed with cotton tip applicator following superficial incision made over their surfaces with a #11 surgical blade. Capillary hemostasis was achieved with 20% aluminum chloride solution. Vaseline ointment was applied to each site. The patient tolerated the procedure well.  Related Medications tretinoin (RETIN-A) 0.025 % cream Apply topically at bedtime. Thin layer to entire face as directed.  Sebaceous hyperplasia Forehead  Benign-appearing.  Observation.  Call clinic for  new or changing lesions.    Acne vulgaris face  Chronic condition with duration over one year. Condition is bothersome to patient. Not currently at goal.  Also with chronic milia, often symptomatic  Start tretinoin 0.025% cream nightly as tolerated  Topical retinoid medications like tretinoin/Retin-A, adapalene/Differin, tazarotene/Fabior, and Epiduo/Epiduo Forte can cause dryness and irritation when first started. Only apply a pea-sized amount to the entire affected area. Avoid applying it around the eyes, edges of mouth and creases at the nose. If you experience irritation, use a good moisturizer first and/or apply the medicine less often. If you are doing well with the medicine, you can increase how often you use it until you are applying every night. Be careful with sun protection while using this medication as it can make you sensitive to the sun. This medicine should not be used by pregnant women.    Seborrheic Keratoses - Stuck-on, waxy, tan-brown papules and/or plaques  - Benign-appearing - Discussed benign etiology and prognosis. - Observe - Call for any changes   Return for 3-4 months acne/milia.  Anise Salvo, RMA, am acting as scribe for Darden Dates, MD .  Documentation: I have reviewed the above documentation for accuracy and completeness, and I agree with the above.  Darden Dates, MD

## 2021-01-28 NOTE — Addendum Note (Signed)
Addended by: Sandi Mealy on: 01/28/2021 09:26 AM   Modules accepted: Level of Service

## 2021-01-29 NOTE — Progress Notes (Signed)
MyChart Video Visit    Virtual Visit via Video Note   This visit type was conducted due to national recommendations for restrictions regarding the COVID-19 Pandemic (e.g. social distancing) in an effort to limit this patient's exposure and mitigate transmission in our community. This patient is at least at moderate risk for complications without adequate follow up. This format is felt to be most appropriate for this patient at this time. Physical exam was limited by quality of the video and audio technology used for the visit.   Patient location: home Provider location: [facility]  I discussed the limitations of evaluation and management by telemedicine and the availability of in person appointments. The patient expressed understanding and agreed to proceed.  Patient: Amanda [name]   DOB: 1990-06-14   30 y.o. Female  MRN: [id number] Visit Date: 01/30/2021  Today's healthcare provider: Jacky Kindle, FNP   Chief Complaint  Patient presents with   Obesity    Patient would like to discuss getting started back on phentermine for weight management. Patient states for the past year her weight has been fluctuating. Patient states that she is actively going to the gym 3-4x a week and is working on diet.    Subjective    HPI HPI     Obesity    Additional comments: Patient would like to discuss getting started back on phentermine for weight management. Patient states for the past year her weight has been fluctuating. Patient states that she is actively going to the gym 3-4x a week and is working on diet.       Last edited by Fonda Kinder, CMA on 01/30/2021  8:28 AM.        Medications: Outpatient Medications Prior to Visit  Medication Sig   albuterol (VENTOLIN HFA) 108 (90 Base) MCG/ACT inhaler Inhale 1-2 puffs into the lungs every 4 (four) hours as needed for wheezing or shortness of breath.   buPROPion (WELLBUTRIN SR) 200 MG 12 hr tablet Take 200 mg by  mouth daily.   FLOVENT HFA 44 MCG/ACT inhaler SMARTSIG:2 Puff(s) By Mouth Twice Daily   ibuprofen (ADVIL) 600 MG tablet Take by mouth every 8 (eight) hours as needed.   levocetirizine (XYZAL) 5 MG tablet TAKE 1 TABLET(5 MG) BY MOUTH EVERY EVENING   meloxicam (MOBIC) 15 MG tablet Take 1 tablet (15 mg total) by mouth daily.   methylPREDNISolone (MEDROL DOSEPAK) 4 MG TBPK tablet 6 day dose pack - take as directed   omeprazole (PRILOSEC) 40 MG capsule Take 1 capsule (40 mg total) by mouth daily.   tretinoin (RETIN-A) 0.025 % cream Apply topically at bedtime. Thin layer to entire face as directed.   ARIPiprazole (ABILIFY) 2 MG tablet Take 2 mg by mouth daily.   atomoxetine (STRATTERA) 40 MG capsule Take 1 capsule by mouth daily. (Patient not taking: Reported on 01/30/2021)   levonorgestrel (MIRENA) 20 MCG/24HR IUD 1 Intra Uterine Device (1 each total) by Intrauterine route once for 1 dose.   [DISCONTINUED] buPROPion (WELLBUTRIN SR) 150 MG 12 hr tablet Take 150 mg by mouth every morning.   No facility-administered medications prior to visit.    Review of Systems  Last CBC Lab Results  Component Value Date   WBC 3.7 (L) 09/29/2019   HGB 12.7 09/29/2019   HCT 38.4 09/29/2019   MCV 90.4 09/29/2019   MCH 29.9 09/29/2019   RDW 13.1 09/29/2019   PLT 202 09/29/2019   Last metabolic panel Lab Results  Component Value Date   GLUCOSE 100 (H) 09/29/2019   NA 136 09/29/2019   K 3.4 (L) 09/29/2019   CL 107 09/29/2019   CO2 24 09/29/2019   BUN 9 09/29/2019   CREATININE 0.82 09/29/2019   GFRNONAA >60 09/29/2019   CALCIUM 8.2 (L) 09/29/2019   PROT 7.0 10/17/2017   ALBUMIN 3.6 10/17/2017   LABGLOB 3.0 06/03/2017   AGRATIO 1.3 06/03/2017   BILITOT 0.8 10/17/2017   ALKPHOS 46 10/17/2017   AST 15 10/17/2017   ALT 18 10/17/2017   ANIONGAP 5 09/29/2019   Last lipids Lab Results  Component Value Date   CHOL 140 06/03/2017   HDL 50 06/03/2017   LDLCALC 77 06/03/2017   TRIG 65 06/03/2017    CHOLHDL 2.8 06/03/2017   Last hemoglobin A1c Lab Results  Component Value Date   HGBA1C 5.4 06/03/2017   Last thyroid functions Lab Results  Component Value Date   TSH 1.800 06/03/2017   Last vitamin D Lab Results  Component Value Date   VD25OH 32.4 09/06/2017      Objective    Ht 5\' 5"  (1.651 m)   Wt (!) 305 lb (138.3 kg) Comment: patient reports  BMI 50.75 kg/m  BP Readings from Last 3 Encounters:  10/01/19 (!) 123/94  09/29/19 105/75  08/10/19 105/70   Wt Readings from Last 3 Encounters:  01/30/21 (!) 305 lb (138.3 kg)  09/29/19 300 lb (136.1 kg)  08/10/19 (!) 303 lb (137.4 kg)      Physical Exam Constitutional:      Appearance: Normal appearance. She is obese.  Pulmonary:     Effort: Pulmonary effort is normal.  Neurological:     General: No focal deficit present.     Mental Status: She is alert.  Psychiatric:        Mood and Affect: Mood normal.        Behavior: Behavior normal.        Thought Content: Thought content normal.        Judgment: Judgment normal.       Assessment & Plan     Problem List Items Addressed This Visit       Cardiovascular and Mediastinum   Tricuspid regurgitation    Chronic, stable Pt reports no concerns with palpitations with previous use Has routine ECHO for monitoring      Mitral valve insufficiency    Chronic, stable Pt reports no concerns with palpitations with previous use Has routine ECHO for monitoring         Respiratory   OSA (obstructive sleep apnea)    Chronic, improving Recently got CPAP machine Doing well with use Less day time fatigue/tiredness        Other   Morbid obesity (HCC) - Primary    BMI 50.75 Recently got insurance- had talked about lap band previously Wants to do things on their own without sx Has upcoming foot surgery Plan to use medication to jump start and then will be recovered to add in exercise Discussed importance of healthy weight management Discussed diet and  exercise Encouraged to research basal metabolic rate and increase water consumption      Relevant Medications   phentermine 37.5 MG capsule   Body mass index (BMI) of 50-59.9 in adult (HCC)    Current BMI 50.75      Relevant Medications   phentermine 37.5 MG capsule     No follow-ups on file.     I discussed the assessment and treatment  plan with the patient. The patient was provided an opportunity to ask questions and all were answered. The patient agreed with the plan and demonstrated an understanding of the instructions.   The patient was advised to call back or seek an in-person evaluation if the symptoms worsen or if the condition fails to improve as anticipated.  I provided 11 minutes of non-face-to-face time during this encounter.  Leilani Merl, FNP, have reviewed all documentation for this visit. The documentation on 01/30/21 for the exam, diagnosis, procedures, and orders are all accurate and complete.   Jacky Kindle, FNP [facility] [phone] (phone) [phone] (fax)  [facility]

## 2021-01-30 ENCOUNTER — Telehealth (INDEPENDENT_AMBULATORY_CARE_PROVIDER_SITE_OTHER): Payer: Medicaid Other | Admitting: Family Medicine

## 2021-01-30 ENCOUNTER — Other Ambulatory Visit: Payer: Self-pay

## 2021-01-30 ENCOUNTER — Encounter: Payer: Self-pay | Admitting: Family Medicine

## 2021-01-30 DIAGNOSIS — I071 Rheumatic tricuspid insufficiency: Secondary | ICD-10-CM | POA: Diagnosis not present

## 2021-01-30 DIAGNOSIS — Z6841 Body Mass Index (BMI) 40.0 and over, adult: Secondary | ICD-10-CM | POA: Diagnosis not present

## 2021-01-30 DIAGNOSIS — I340 Nonrheumatic mitral (valve) insufficiency: Secondary | ICD-10-CM

## 2021-01-30 DIAGNOSIS — G4733 Obstructive sleep apnea (adult) (pediatric): Secondary | ICD-10-CM | POA: Diagnosis not present

## 2021-01-30 MED ORDER — PHENTERMINE HCL 37.5 MG PO CAPS: 37.5000 mg | ORAL_CAPSULE | ORAL | 0 refills | Status: DC

## 2021-01-30 NOTE — Assessment & Plan Note (Signed)
Chronic, stable Pt reports no concerns with palpitations with previous use Has routine ECHO for monitoring  

## 2021-01-30 NOTE — Assessment & Plan Note (Signed)
BMI 50.75 Recently got insurance- had talked about lap band previously Wants to do things on their own without sx Has upcoming foot surgery Plan to use medication to jump start and then will be recovered to add in exercise Discussed importance of healthy weight management Discussed diet and exercise Encouraged to research basal metabolic rate and increase water consumption

## 2021-01-30 NOTE — Assessment & Plan Note (Signed)
Chronic, stable Pt reports no concerns with palpitations with previous use Has routine ECHO for monitoring

## 2021-01-30 NOTE — Assessment & Plan Note (Signed)
Current BMI 50.75

## 2021-01-30 NOTE — Assessment & Plan Note (Signed)
Chronic, improving Recently got CPAP machine Doing well with use Less day time fatigue/tiredness

## 2021-02-13 ENCOUNTER — Ambulatory Visit: Payer: Medicaid Other | Admitting: Podiatry

## 2021-02-13 ENCOUNTER — Other Ambulatory Visit: Payer: Self-pay

## 2021-02-13 DIAGNOSIS — M722 Plantar fascial fibromatosis: Secondary | ICD-10-CM

## 2021-02-19 ENCOUNTER — Other Ambulatory Visit: Payer: Self-pay | Admitting: Family Medicine

## 2021-02-19 DIAGNOSIS — K219 Gastro-esophageal reflux disease without esophagitis: Secondary | ICD-10-CM

## 2021-02-19 NOTE — Telephone Encounter (Signed)
Requested Prescriptions  Pending Prescriptions Disp Refills   omeprazole (PRILOSEC) 40 MG capsule [Pharmacy Med Name: OMEPRAZOLE 40MG  CAPSULES] 90 capsule 1    Sig: TAKE 1 CAPSULE(40 MG) BY MOUTH DAILY     Gastroenterology: Proton Pump Inhibitors Passed - 02/19/2021  3:10 PM      Passed - Valid encounter within last 12 months    Recent Outpatient Visits          2 weeks ago Morbid obesity [facility])   [facility], FNP   6 months ago Snoring   [facility], [facility], PA-C   8 months ago Urinary frequency   [facility] Just, [employer], FNP   9 months ago Snoring   [facility]e Course, [employer], Alessandra Bevels   1 year ago URI, acute   [place], [employer], PA-C      Future Appointments            In 3 months Lavella Hammock, MD [facility]

## 2021-02-24 NOTE — Progress Notes (Signed)
° °  Subjective: 31 y.o. female presenting for follow-up evaluation of plantar fasciitis to the right foot.  Patient states that she is feeling much better since the last injections she received.  She was also placed on a Medrol Dosepak and meloxicam was prescribed.  She has been weightbearing in the cam boot.  The patient states that although she does feel better she has been dealing with this pain for over 1 year now despite conservative treatment options.  She continues to have some tenderness.  She believes that now is a good time to pursue surgery which was lightly discussed last visit.  She presents for further treatment and evaluation   Past Medical History:  Diagnosis Date   ADD (attention deficit disorder)    Allergy    Asthma    Blood transfusion without reported diagnosis    Heart murmur    Hypertension    Obesity      Objective: Physical Exam General: The patient is alert and oriented x3 in no acute distress.  Dermatology: Skin is warm, dry and supple bilateral lower extremities. Negative for open lesions or macerations bilateral.   Vascular: Dorsalis Pedis and Posterior Tibial pulses palpable bilateral.  Capillary fill time is immediate to all digits.  Neurological: Epicritic and protective threshold intact bilateral.   Musculoskeletal: There continues to be some tenderness to palpation to the plantar aspect of the right heel along the plantar fascia. All other joints range of motion within normal limits bilateral. Strength 5/5 in all groups bilateral.    Assessment: 1. Plantar fasciitis right; chronic  Plan of Care:  1. Patient evaluated.  2. Today we discussed the conservative versus surgical management of the presenting pathology. The patient opts for surgical management. All possible complications and details of the procedure were explained. All patient questions were answered. No guarantees were expressed or implied. 3. Authorization for surgery was initiated  today. Surgery will consist of endoscopic plantar fasciotomy right 4.  Patient will need PCP and cardiac clearance prior to surgery.  Patient has history of heart murmur which will need to be interrogated prior to surgery 5.  Return to clinic 1 week postop   Edrick Kins, DPM [facility]  Dr. Edrick Kins, DPM    [address], [place] [address]                Office [phone]  Fax [phone]

## 2021-02-26 ENCOUNTER — Telehealth: Payer: Self-pay

## 2021-02-26 ENCOUNTER — Ambulatory Visit: Payer: Medicaid Other | Admitting: Dermatology

## 2021-02-26 DIAGNOSIS — I340 Nonrheumatic mitral (valve) insufficiency: Secondary | ICD-10-CM

## 2021-02-26 NOTE — Telephone Encounter (Signed)
Copied from CRM [id number]. Topic: Referral - Request for Referral >> Feb 26, 2021 12:06 PM Fields, [facility] R wrote: Has patient seen PCP for this complaint? Yes.   *If NO, is insurance requiring patient see PCP for this issue before PCP can refer them? Referral for which specialty: cardiology  Preferred provider/office: n/a Reason for referral: surgeon says she needs one

## 2021-03-10 ENCOUNTER — Telehealth: Payer: Self-pay

## 2021-03-10 NOTE — Telephone Encounter (Signed)
Patient called office to get status of referral ordered on 02/26/21, patient stats that she is trying to schedule a surgery and wanted to know when appt would be scheduled? I see on 02/26/21 it was written sent to [facility]. Do we have an update on referral. KW

## 2021-03-10 NOTE — Telephone Encounter (Signed)
Attempted to reach patient with no voicemail box set up yet.If patient returns call please triage. KW

## 2021-03-10 NOTE — Telephone Encounter (Signed)
Copied from CRM [id number]. Topic: General - Other >> Mar 10, 2021 11:29 AM Gaetana Michaelis A wrote: Reason for CRM: The patient would like to be contacted by a member of staff when possible about their referral to cardiology   Please contact further when available

## 2021-03-11 NOTE — Telephone Encounter (Signed)
Sent my chart message to patient to reach back out to our referral team. Renette Butters

## 2021-03-12 ENCOUNTER — Ambulatory Visit (INDEPENDENT_AMBULATORY_CARE_PROVIDER_SITE_OTHER): Payer: Medicaid Other | Admitting: Internal Medicine

## 2021-03-12 ENCOUNTER — Encounter: Payer: Self-pay | Admitting: Internal Medicine

## 2021-03-12 ENCOUNTER — Other Ambulatory Visit: Payer: Self-pay

## 2021-03-12 VITALS — BP 100/80 | HR 93 | Ht 65.0 in | Wt 292.4 lb

## 2021-03-12 DIAGNOSIS — R0789 Other chest pain: Secondary | ICD-10-CM

## 2021-03-12 DIAGNOSIS — I071 Rheumatic tricuspid insufficiency: Secondary | ICD-10-CM

## 2021-03-12 DIAGNOSIS — I340 Nonrheumatic mitral (valve) insufficiency: Secondary | ICD-10-CM | POA: Diagnosis not present

## 2021-03-12 DIAGNOSIS — Z01810 Encounter for preprocedural cardiovascular examination: Secondary | ICD-10-CM

## 2021-03-12 DIAGNOSIS — R0609 Other forms of dyspnea: Secondary | ICD-10-CM

## 2021-03-12 NOTE — Progress Notes (Signed)
Follow-up Outpatient Visit Date: 03/12/2021  Primary Care Provider: Gwyneth Sprout, [place] [address]  Chief Complaint: Preop evaluation  HPI:  [name] is a 31 y.o. female with history of hypertension, mitral and tricuspid regurgitation, asthma, sleep apnea on CPAP, and ADD, who presents for preoperative cardiovascular risk assessment in anticipation of plantar fasciitis surgery.  I saw her once in 04/2019 for evaluation of chest pain and valvular heart disease.  At the time she stated "I need an echocardiogram."  She had previously been evaluated by Dr. Chancy Milroy.  She reported atypical chest pain and chronic exertional dyspnea.  We referred her for an echocardiogram, though she she neither obtained the echocardiogram nor return to see [place] for follow-up.  She was very busy with other things in her life and therefore was unable to schedule the echo and follow-up.  Today, [name] reports that she feels about the same as at our last visit.  She continues to have sporadic, sharp pain radiating from her chest to her back that happens randomly every 1-2 weeks.  It lasts a few seconds and then resolves spontaneously.  She also notes tightness in the chest with strenuous activity, which has been present for years.  She is not sure if it is actually discomfort or shortness of breath related to her asthma.  She is able to climb 2 flights of stairs without significant chest pain.  She notes some dependent ankle edema at times.  She has not had any palpitations or lightheadedness.  She uses CPAP regularly.  She has been trying to lose weight and happily reports that her weight is down about 15 pounds in the last month.  --------------------------------------------------------------------------------------------------  Cardiovascular History & Procedures: Cardiovascular Problems: Chest pain Mitral and tricuspid regurgitation   Risk Factors: Hypertension and morbid obesity    Cath/PCI: None   CV Surgery: None   EP Procedures and Devices: None   Non-Invasive Evaluation(s): None available.  Recent CV Pertinent Labs: Lab Results  Component Value Date   CHOL 140 06/03/2017   HDL 50 06/03/2017   LDLCALC 77 06/03/2017   TRIG 65 06/03/2017   CHOLHDL 2.8 06/03/2017   K 3.4 (L) 09/29/2019   BUN 9 09/29/2019   BUN 6 06/03/2017   CREATININE 0.82 09/29/2019    Past medical and surgical history were reviewed and updated in EPIC.  Current Meds  Medication Sig   albuterol (VENTOLIN HFA) 108 (90 Base) MCG/ACT inhaler Inhale 1-2 puffs into the lungs every 4 (four) hours as needed for wheezing or shortness of breath.   atomoxetine (STRATTERA) 40 MG capsule Take 1 capsule by mouth daily.   buPROPion (WELLBUTRIN SR) 200 MG 12 hr tablet Take 200 mg by mouth daily.   FLOVENT HFA 44 MCG/ACT inhaler SMARTSIG:2 Puff(s) By Mouth Twice Daily   ibuprofen (ADVIL) 600 MG tablet Take by mouth every 8 (eight) hours as needed.   levocetirizine (XYZAL) 5 MG tablet TAKE 1 TABLET(5 MG) BY MOUTH EVERY EVENING   levonorgestrel (MIRENA) 20 MCG/24HR IUD 1 Intra Uterine Device (1 each total) by Intrauterine route once for 1 dose.   omeprazole (PRILOSEC) 40 MG capsule TAKE 1 CAPSULE(40 MG) BY MOUTH DAILY   phentermine 37.5 MG capsule Take 1 capsule (37.5 mg total) by mouth every morning.    Allergies: Doxycycline  Social History   Tobacco Use   Smoking status: Former    Packs/day: 0.04    Years: 8.00    Pack years: 0.32  Types: Cigarettes    Quit date: 08/06/2016    Years since quitting: 4.6   Smokeless tobacco: Never  Vaping Use   Vaping Use: Never used  Substance Use Topics   Alcohol use: Yes   Drug use: No    Family History  Problem Relation Age of Onset   Cancer Paternal Aunt        Breast Cancer   Asthma Mother    Diabetes Mellitus II Sister    Hypertension Maternal Grandfather    Heart disease Maternal Grandfather     Review of Systems: A 12-system  review of systems was performed and was negative except as noted in the HPI.  --------------------------------------------------------------------------------------------------  Physical Exam: BP 100/80 (BP Location: Left Arm, Patient Position: Sitting, Cuff Size: Large)    Pulse 93    Ht 5\' 5"  (1.651 m)    Wt 292 lb 6 oz (132.6 kg)    SpO2 98%    BMI 48.65 kg/m   General:  NAD. Neck: Unable to assess JVD due to body habitus. Lungs: Clear to auscultation bilaterally without wheezes or crackles. Heart: Regular rate and rhythm without murmurs, rubs, or gallops. Abdomen: Soft, nontender, nondistended. Extremities: Trace ankle edema.  EKG:  Normal sinus rhythm with low voltage.  Otherwise, no significant abnormality.  Lab Results  Component Value Date   WBC 3.7 (L) 09/29/2019   HGB 12.7 09/29/2019   HCT 38.4 09/29/2019   MCV 90.4 09/29/2019   PLT 202 09/29/2019    Lab Results  Component Value Date   NA 136 09/29/2019   K 3.4 (L) 09/29/2019   CL 107 09/29/2019   CO2 24 09/29/2019   BUN 9 09/29/2019   CREATININE 0.82 09/29/2019   GLUCOSE 100 (H) 09/29/2019   ALT 18 10/17/2017    Lab Results  Component Value Date   CHOL 140 06/03/2017   HDL 50 06/03/2017   LDLCALC 77 06/03/2017   TRIG 65 06/03/2017   CHOLHDL 2.8 06/03/2017    --------------------------------------------------------------------------------------------------  ASSESSMENT AND PLAN: Mitral and tricuspid regurgitation, dyspnea on exertion, and atypical chest pain: Symptoms and echo findings have been present for several years; DOE and chest pain are stable.  [name] reports that prior echo's by Dr. Humphrey Rolls showed only mild MR and TR; I would not expect that degree of valvular heart to disease to explain her symptoms.  I suggest morbid obesity and deconditioning, as well as component of asthma, are driving her symptoms.  Her young age makes ischemic heart disease less likely.  We will obtain an echocardiogram at  her convenience.  If no significant structural heart disease is identified, I think it would be reasonable to defer additional testing unless symptoms do not improve or worsen in spite of weight loss.  Preoperative cardiovascular risk assessment: [name] is planning to undergo surgery on the right foot for management of her plantar fasciitis.  From a cardiac standpoint, this is a low-risk procedure.  Given chronic DOE and atypical chest pain outlined above and indeterminate valvular heart disease, I recommend obtaining an echocardiogram before proceeding with elective surgery.  If the echo does not show significant structural heart disease, [name] can go ahead with her surgery without additional cardiac testing/intervention.  Follow-up: Return to clinic in 3 months.  Nelva Bush, MD 03/12/2021 8:06 PM

## 2021-03-12 NOTE — Patient Instructions (Signed)
Medication Instructions:  - Your physician recommends that you continue on your current medications as directed. Please refer to the Current Medication list given to you today.  *If you need a refill on your cardiac medications before your next appointment, please call your pharmacy*   Lab Work: - none ordered  If you have labs (blood work) drawn today and your tests are completely normal, you will receive your results only by: MyChart Message (if you have MyChart) OR A paper copy in the mail If you have any lab test that is abnormal or we need to change your treatment, we will call you to review the results.   Testing/Procedures: - Your physician has requested that you have an echocardiogram. Echocardiography is a painless test that uses sound waves to create images of your heart. It provides your doctor with information about the size and shape of your heart and how well your hearts chambers and valves are working. This procedure takes approximately one hour. There are no restrictions for this procedure. There is a possibility that an IV may need to be started during your test to inject an image enhancing agent. This is done to obtain more optimal pictures of your heart. Therefore we ask that you do at least drink some water prior to coming in to hydrate your veins.      Follow-Up: At [facility], you and your health needs are our priority.  As part of our continuing mission to provide you with exceptional heart care, we have created designated Provider Care Teams.  These Care Teams include your primary Cardiologist (physician) and Advanced Practice Providers (APPs -  Physician Assistants and Nurse Practitioners) who all work together to provide you with the care you need, when you need it.  We recommend signing up for the patient portal called "MyChart".  Sign up information is provided on this After Visit Summary.  MyChart is used to connect with patients for Virtual Visits  (Telemedicine).  Patients are able to view lab/test results, encounter notes, upcoming appointments, etc.  Non-urgent messages can be sent to your provider as well.   To learn more about what you can do with MyChart, go to [email or web address].    Your next appointment:   3 month(s)  The format for your next appointment:   In Person  Provider:   You may see Yvonne Kendall, MD or one of the following Advanced Practice Providers on your designated Care Team:   Nicolasa Ducking, NP Eula Listen, PA-C Cadence Fransico Michael, [place]    Other Instructions  Echocardiogram An echocardiogram is a test that uses sound waves (ultrasound) to produce images of the heart. Images from an echocardiogram can provide important information about: Heart size and shape. The size and thickness and movement of your heart's walls. Heart muscle function and strength. Heart valve function or if you have stenosis. Stenosis is when the heart valves are too narrow. If blood is flowing backward through the heart valves (regurgitation). A tumor or infectious growth around the heart valves. Areas of heart muscle that are not working well because of poor blood flow or injury from a heart attack. Aneurysm detection. An aneurysm is a weak or damaged part of an artery wall. The wall bulges out from the normal force of blood pumping through the body. Tell a health care provider about: Any allergies you have. All medicines you are taking, including vitamins, herbs, eye drops, creams, and over-the-counter medicines. Any blood disorders you have. Any surgeries you have had.  Any medical conditions you have. Whether you are pregnant or may be pregnant. What are the risks? Generally, this is a safe test. However, problems may occur, including an allergic reaction to dye (contrast) that may be used during the test. What happens before the test? No specific preparation is needed. You may eat and drink normally. What happens  during the test?  You will take off your clothes from the waist up and put on a hospital gown. Electrodes or electrocardiogram (ECG)patches may be placed on your chest. The electrodes or patches are then connected to a device that monitors your heart rate and rhythm. You will lie down on a table for an ultrasound exam. A gel will be applied to your chest to help sound waves pass through your skin. A handheld device, called a transducer, will be pressed against your chest and moved over your heart. The transducer produces sound waves that travel to your heart and bounce back (or "echo" back) to the transducer. These sound waves will be captured in real-time and changed into images of your heart that can be viewed on a video monitor. The images will be recorded on a computer and reviewed by your health care provider. You may be asked to change positions or hold your breath for a short time. This makes it easier to get different views or better views of your heart. In some cases, you may receive contrast through an IV in one of your veins. This can improve the quality of the pictures from your heart. The procedure may vary among health care providers and hospitals. What can I expect after the test? You may return to your normal, everyday life, including diet, activities, and medicines, unless your health care provider tells you not to do that. Follow these instructions at home: It is up to you to get the results of your test. Ask your health care provider, or the department that is doing the test, when your results will be ready. Keep all follow-up visits. This is important. Summary An echocardiogram is a test that uses sound waves (ultrasound) to produce images of the heart. Images from an echocardiogram can provide important information about the size and shape of your heart, heart muscle function, heart valve function, and other possible heart problems. You do not need to do anything to prepare  before this test. You may eat and drink normally. After the echocardiogram is completed, you may return to your normal, everyday life, unless your health care provider tells you not to do that. This information is not intended to replace advice given to you by your health care provider. Make sure you discuss any questions you have with your health care provider. Document Revised: 10/22/2020 Document Reviewed: 10/02/2019 Elsevier Patient Education  2022 [employer].

## 2021-03-13 ENCOUNTER — Telehealth: Payer: Self-pay | Admitting: Internal Medicine

## 2021-03-13 NOTE — Telephone Encounter (Signed)
Attempted to schedule no ans no vm   Needs testing and fu

## 2021-04-02 ENCOUNTER — Encounter: Payer: Self-pay | Admitting: Podiatry

## 2021-04-02 ENCOUNTER — Telehealth: Payer: Self-pay | Admitting: Internal Medicine

## 2021-04-02 NOTE — Telephone Encounter (Signed)
° °  Pre-operative Risk Assessment    Patient Name: Amanda [name]  DOB: 02-Jan-1991 MRN: [id number]      Request for Surgical Clearance    Procedure:   endoscopic plantar fasciotomy right   Date of Surgery:  Clearance 04/30/21                                 Surgeon:  Gala Lewandowsky, DPM Surgeon's Group or Practice Name:  [facility]  Phone number:  [phone] Fax number:  [phone]   Type of Clearance Requested:   - Medical    Type of Anesthesia:  General    Additional requests/questions:    Kymani, Laursen   04/02/2021, 3:50 PM

## 2021-04-02 NOTE — Telephone Encounter (Signed)
Patient recently seen in Dr. Serita Kyle office on January 19 for preoperative clearance plantar fasciitis.  Dr. Okey Dupre recommend that the patient undergo echo prior to granting surgical clearance.  Per Dr.End if the echo does not show structural heart disease the patient can proceed as planned with procedure.  Echo is scheduled for February 13th.

## 2021-04-06 ENCOUNTER — Other Ambulatory Visit: Payer: Self-pay

## 2021-04-06 ENCOUNTER — Telehealth: Payer: Self-pay | Admitting: Urology

## 2021-04-06 ENCOUNTER — Ambulatory Visit (INDEPENDENT_AMBULATORY_CARE_PROVIDER_SITE_OTHER): Payer: Medicaid Other

## 2021-04-06 DIAGNOSIS — I340 Nonrheumatic mitral (valve) insufficiency: Secondary | ICD-10-CM | POA: Diagnosis not present

## 2021-04-06 DIAGNOSIS — I071 Rheumatic tricuspid insufficiency: Secondary | ICD-10-CM | POA: Diagnosis not present

## 2021-04-06 LAB — ECHOCARDIOGRAM COMPLETE
AR max vel: 4.29 cm2
AV Area VTI: 4.28 cm2
AV Area mean vel: 4.45 cm2
AV Mean grad: 2 mmHg
AV Peak grad: 4 mmHg
Ao pk vel: 1 m/s
Area-P 1/2: 3.43 cm2
Calc EF: 72.2 %
S' Lateral: 3.3 cm
Single Plane A2C EF: 76.4 %
Single Plane A4C EF: 65.9 %

## 2021-04-06 NOTE — Telephone Encounter (Signed)
DOS - 04/30/21  EPF RIGHT --- [phone]  [employer] EFFECTIVE DATE - 01/22/21  RECEIVED FAX FROM [employer] STATING THAT CPT CODE 26378 NO PRIOR AUTH REQUIRED, NOW THAT [facility] IS IN NETWORK.  REF # [id number] TRACKING ID # [id number]

## 2021-04-07 ENCOUNTER — Telehealth: Payer: Self-pay | Admitting: *Deleted

## 2021-04-07 NOTE — Telephone Encounter (Signed)
° ° °  Patient Name: Amanda [name]  DOB: 04-Feb-1991 MRN: [id number]  Primary Cardiologist: Dr. Okey Dupre  Chart reviewed as part of pre-operative protocol coverage. Patient was recently seen by Dr. Okey Dupre on 03/22/2021 for pre-op evaluation for upcoming plantar fasciitis surgery at which time she reported atypical chest pain and chronic dyspnea. Echo was ordered for further pre-op evaluation. Echo on 04/06/2021 showed LVEF of 60-65% with normal wall motion and no significant valvular disease. Per Dr. Okey Dupre, "I think it is reasonable for her to proceed with plantar fasciitis surgery without further cardiac testing or intervention."    I will route this recommendation to the requesting party via Epic fax function and remove from pre-op pool.  Please call with questions.  Corrin Parker, PA-C 04/07/2021, 12:21 PM

## 2021-04-07 NOTE — Telephone Encounter (Signed)
The patient has been notified of the result and verbalized understanding.  All questions (if any) were answered.     

## 2021-04-07 NOTE — Telephone Encounter (Signed)
-----   Message from Nelva Bush, MD sent at 04/07/2021  6:29 AM EST ----- Please let Amanda [name] know that her echocardiogram is normal.  No significant mitral regurgitations was observed.  I think it is reasonable for her to proceed with plantar fasciitis surgery without further cardiac testing or intervention.

## 2021-04-08 NOTE — Progress Notes (Signed)
Established patient visit   Patient: Amanda [name]   DOB: [birth year]   30 y.o. Female  MRN: [id number] Visit Date: 04/09/2021  Today's healthcare provider: Jacky Kindle, FNP   No chief complaint on file.  Subjective    HPI  Patient is a 31 year old female who presents for surgical clearance. Surgical date is 04/30/21.  She is scheduled on 04/30/21 to have Plantar Fasciotomy by Dr Gala Lewandowsky from the [facility] [place].   Patient had Echocardiogram through Dr Janina Mayo office on 04/06/21 which was normal and per phone message to patient Dr End cleared her for surgery.  Medications: Outpatient Medications Prior to Visit  Medication Sig   albuterol (VENTOLIN HFA) 108 (90 Base) MCG/ACT inhaler Inhale 1-2 puffs into the lungs every 4 (four) hours as needed for wheezing or shortness of breath.   atomoxetine (STRATTERA) 40 MG capsule Take 1 capsule by mouth daily.   buPROPion (WELLBUTRIN SR) 200 MG 12 hr tablet Take 200 mg by mouth daily.   ibuprofen (ADVIL) 600 MG tablet Take by mouth every 8 (eight) hours as needed.   levocetirizine (XYZAL) 5 MG tablet TAKE 1 TABLET(5 MG) BY MOUTH EVERY EVENING   omeprazole (PRILOSEC) 40 MG capsule TAKE 1 CAPSULE(40 MG) BY MOUTH DAILY   [DISCONTINUED] phentermine 37.5 MG capsule Take 1 capsule (37.5 mg total) by mouth every morning.   levonorgestrel (MIRENA) 20 MCG/24HR IUD 1 Intra Uterine Device (1 each total) by Intrauterine route once for 1 dose.   [DISCONTINUED] ARIPiprazole (ABILIFY) 2 MG tablet Take 2 mg by mouth daily.   [DISCONTINUED] FLOVENT HFA 44 MCG/ACT inhaler SMARTSIG:2 Puff(s) By Mouth Twice Daily   [DISCONTINUED] methylPREDNISolone (MEDROL DOSEPAK) 4 MG TBPK tablet 6 day dose pack - take as directed   No facility-administered medications prior to visit.    Review of Systems  Constitutional:  Negative for fatigue and fever.  Respiratory:  Negative for cough, shortness of breath and stridor.    Cardiovascular:  Negative for chest pain, palpitations and leg swelling.  Gastrointestinal:  Negative for abdominal pain, diarrhea, nausea and vomiting.  Musculoskeletal:  Negative for myalgias.  Neurological:  Negative for dizziness, light-headedness and headaches.      Objective    BP 117/80 (BP Location: Left Arm, Patient Position: Sitting, Cuff Size: Large)    Pulse 94    Temp 98.7 F (37.1 C) (Oral)    Wt 287 lb (130.2 kg)    SpO2 98%    BMI 47.76 kg/m    Physical Exam Vitals and nursing note reviewed.  Constitutional:      General: She is not in acute distress.    Appearance: Normal appearance. She is obese. She is not ill-appearing, toxic-appearing or diaphoretic.  HENT:     Head: Normocephalic and atraumatic.  Cardiovascular:     Rate and Rhythm: Normal rate and regular rhythm.     Pulses: Normal pulses.     Heart sounds: Normal heart sounds. No murmur heard.   No friction rub. No gallop.  Pulmonary:     Effort: Pulmonary effort is normal. No respiratory distress.     Breath sounds: Normal breath sounds. No stridor. No wheezing, rhonchi or rales.  Chest:     Chest wall: No tenderness.  Abdominal:     General: Bowel sounds are normal.     Palpations: Abdomen is soft.  Musculoskeletal:        General: No swelling, tenderness,  deformity or signs of injury. Normal range of motion.     Right lower leg: No edema.     Left lower leg: No edema.  Skin:    General: Skin is warm and dry.     Capillary Refill: Capillary refill takes less than 2 seconds.     Coloration: Skin is not jaundiced or pale.     Findings: No bruising, erythema, lesion or rash.  Neurological:     General: No focal deficit present.     Mental Status: She is alert and oriented to person, place, and time. Mental status is at baseline.     Cranial Nerves: No cranial nerve deficit.     Sensory: No sensory deficit.     Motor: No weakness.     Coordination: Coordination normal.  Psychiatric:         Mood and Affect: Mood normal.        Behavior: Behavior normal.        Thought Content: Thought content normal.        Judgment: Judgment normal.     No results found for any visits on 04/09/21.  Assessment & Plan     Problem List Items Addressed This Visit       Other   Morbid obesity (HCC)    BMI currently 47.76 Discussed importance of healthy weight management Discussed diet and exercise       Relevant Medications   phentermine 37.5 MG capsule   Other Relevant Orders   Comprehensive metabolic panel   Lipid panel   T4, free   TSH   Hemoglobin A1c   Fatigue    Likely due to body habitus and lack of exercise tolerance       Relevant Orders   T4, free   TSH   Preoperative clearance - Primary    Has been cleared by cardiology; no murmur on exam, negative regurg. Low risk from surgical standpoint outside of morbid obesity; however, pt has reduced her weight with use of assistance PO medication to assist      Bruises easily    3+ bruises at a time, various stages of healing      Relevant Orders   CBC with Differential/Platelet   Encounter for hepatitis C screening test for low risk patient   Relevant Orders   Hepatitis C Antibody   Encounter for screening for HIV   Relevant Orders   HIV antibody (with reflex)   Low vitamin D level    Hx of low vit d  Repeat levels in light of obesity      Relevant Orders   VITAMIN D 25 Hydroxy (Vit-D Deficiency, Fractures)   Screening, lipid    FLP completed today      Relevant Orders   Lipid panel   RESOLVED: Body mass index (BMI) of 50-59.9 in adult [facility])   Relevant Medications   phentermine 37.5 MG capsule   Other Visit Diagnoses     Class 3 severe obesity due to excess calories with serious comorbidity and body mass index (BMI) of 45.0 to 49.9 in adult [facility])       Relevant Medications   phentermine 37.5 MG capsule        Return in about 3 months (around 07/07/2021) for chonic disease management.       Vivien Rota DeSanto,acting as a scribe for Jacky Kindle, FNP.,have documented all relevant documentation on the behalf of Jacky Kindle, FNP,as directed by  Jacky Kindle, FNP  while in the presence of Jacky Kindle, FNP.  Leilani Merl, FNP, have reviewed all documentation for this visit. The documentation on 04/09/21 for the exam, diagnosis, procedures, and orders are all accurate and complete.    Jacky Kindle, FNP  [facility] [phone] (phone) [phone] (fax)  [facility]

## 2021-04-09 ENCOUNTER — Ambulatory Visit: Payer: Medicaid Other | Admitting: Family Medicine

## 2021-04-09 ENCOUNTER — Encounter: Payer: Self-pay | Admitting: Family Medicine

## 2021-04-09 ENCOUNTER — Other Ambulatory Visit: Payer: Self-pay

## 2021-04-09 VITALS — BP 117/80 | HR 94 | Temp 98.7°F | Wt 287.0 lb

## 2021-04-09 DIAGNOSIS — Z6841 Body Mass Index (BMI) 40.0 and over, adult: Secondary | ICD-10-CM

## 2021-04-09 DIAGNOSIS — R233 Spontaneous ecchymoses: Secondary | ICD-10-CM

## 2021-04-09 DIAGNOSIS — Z114 Encounter for screening for human immunodeficiency virus [HIV]: Secondary | ICD-10-CM | POA: Insufficient documentation

## 2021-04-09 DIAGNOSIS — T733XXA Exhaustion due to excessive exertion, initial encounter: Secondary | ICD-10-CM

## 2021-04-09 DIAGNOSIS — R7989 Other specified abnormal findings of blood chemistry: Secondary | ICD-10-CM | POA: Insufficient documentation

## 2021-04-09 DIAGNOSIS — Z13220 Encounter for screening for lipoid disorders: Secondary | ICD-10-CM | POA: Insufficient documentation

## 2021-04-09 DIAGNOSIS — Z01818 Encounter for other preprocedural examination: Secondary | ICD-10-CM | POA: Insufficient documentation

## 2021-04-09 DIAGNOSIS — Z1159 Encounter for screening for other viral diseases: Secondary | ICD-10-CM | POA: Insufficient documentation

## 2021-04-09 MED ORDER — PHENTERMINE HCL 37.5 MG PO CAPS: 37.5000 mg | ORAL_CAPSULE | ORAL | 0 refills | Status: DC

## 2021-04-09 NOTE — Assessment & Plan Note (Signed)
FLP completed today

## 2021-04-09 NOTE — Assessment & Plan Note (Signed)
Hx of low vit d  Repeat levels in light of obesity

## 2021-04-09 NOTE — Assessment & Plan Note (Signed)
BMI currently 47.76 Discussed importance of healthy weight management Discussed diet and exercise

## 2021-04-09 NOTE — Assessment & Plan Note (Signed)
Has been cleared by cardiology; no murmur on exam, negative regurg. Low risk from surgical standpoint outside of morbid obesity; however, pt has reduced her weight with use of assistance PO medication to assist

## 2021-04-09 NOTE — Assessment & Plan Note (Signed)
Likely due to body habitus and lack of exercise tolerance

## 2021-04-09 NOTE — Assessment & Plan Note (Signed)
3+ bruises at a time, various stages of healing

## 2021-04-10 ENCOUNTER — Encounter: Payer: Self-pay | Admitting: Family Medicine

## 2021-04-10 LAB — COMPREHENSIVE METABOLIC PANEL
ALT: 18 IU/L (ref 0–32)
AST: 14 IU/L (ref 0–40)
Albumin/Globulin Ratio: 1.5 (ref 1.2–2.2)
Albumin: 4.1 g/dL (ref 3.8–4.8)
Alkaline Phosphatase: 61 IU/L (ref 44–121)
BUN/Creatinine Ratio: 9 (ref 9–23)
BUN: 8 mg/dL (ref 6–20)
Bilirubin Total: 0.6 mg/dL (ref 0.0–1.2)
CO2: 22 mmol/L (ref 20–29)
Calcium: 9.2 mg/dL (ref 8.7–10.2)
Chloride: 105 mmol/L (ref 96–106)
Creatinine, Ser: 0.91 mg/dL (ref 0.57–1.00)
Globulin, Total: 2.7 g/dL (ref 1.5–4.5)
Glucose: 99 mg/dL (ref 70–99)
Potassium: 4.1 mmol/L (ref 3.5–5.2)
Sodium: 140 mmol/L (ref 134–144)
Total Protein: 6.8 g/dL (ref 6.0–8.5)
eGFR: 86 mL/min/{1.73_m2} (ref >59)

## 2021-04-10 LAB — CBC WITH DIFFERENTIAL/PLATELET
Basophils Absolute: 0.1 10*3/uL (ref 0.0–0.2)
Basos: 1 %
EOS (ABSOLUTE): 0.4 10*3/uL (ref 0.0–0.4)
Eos: 5 %
Hematocrit: 39.7 % (ref 34.0–46.6)
Hemoglobin: 13 g/dL (ref 11.1–15.9)
Immature Grans (Abs): 0 10*3/uL (ref 0.0–0.1)
Immature Granulocytes: 0 %
Lymphocytes Absolute: 1.5 10*3/uL (ref 0.7–3.1)
Lymphs: 22 %
MCH: 28.8 pg (ref 26.6–33.0)
MCHC: 32.7 g/dL (ref 31.5–35.7)
MCV: 88 fL (ref 79–97)
Monocytes Absolute: 0.6 10*3/uL (ref 0.1–0.9)
Monocytes: 8 %
Neutrophils Absolute: 4.3 10*3/uL (ref 1.4–7.0)
Neutrophils: 64 %
Platelets: 317 10*3/uL (ref 150–450)
RBC: 4.51 x10E6/uL (ref 3.77–5.28)
RDW: 14.2 % (ref 11.7–15.4)
WBC: 6.8 10*3/uL (ref 3.4–10.8)

## 2021-04-10 LAB — LIPID PANEL
Chol/HDL Ratio: 3 ratio (ref 0.0–4.4)
Cholesterol, Total: 145 mg/dL (ref 100–199)
HDL: 48 mg/dL (ref >39)
LDL Chol Calc (NIH): 84 mg/dL (ref 0–99)
Triglycerides: 64 mg/dL (ref 0–149)
VLDL Cholesterol Cal: 13 mg/dL (ref 5–40)

## 2021-04-10 LAB — HEPATITIS C ANTIBODY: Hep C Virus Ab: NONREACTIVE

## 2021-04-10 LAB — HEMOGLOBIN A1C
Est. average glucose Bld gHb Est-mCnc: 123 mg/dL
Hgb A1c MFr Bld: 5.9 % — ABNORMAL HIGH (ref 4.8–5.6)

## 2021-04-10 LAB — VITAMIN D 25 HYDROXY (VIT D DEFICIENCY, FRACTURES): Vit D, 25-Hydroxy: 14.8 ng/mL — ABNORMAL LOW (ref 30.0–100.0)

## 2021-04-10 LAB — T4, FREE: Free T4: 1.16 ng/dL (ref 0.82–1.77)

## 2021-04-10 LAB — TSH: TSH: 0.724 u[IU]/mL (ref 0.450–4.500)

## 2021-04-10 LAB — HIV ANTIBODY (ROUTINE TESTING W REFLEX): HIV Screen 4th Generation wRfx: NONREACTIVE

## 2021-04-10 MED ORDER — VITAMIN D (ERGOCALCIFEROL) 1.25 MG (50000 UNIT) PO CAPS: 50000.0000 [IU] | ORAL_CAPSULE | ORAL | 1 refills | Status: DC

## 2021-04-17 ENCOUNTER — Ambulatory Visit: Payer: Medicaid Other | Admitting: Family Medicine

## 2021-04-27 ENCOUNTER — Telehealth: Payer: Self-pay

## 2021-04-27 NOTE — Telephone Encounter (Signed)
Copied from CRM [id number]. Topic: Referral - Question ?>> Apr 27, 2021 10:13 AM Pawlus, Maxine Glenn A wrote: ?Reason for CRM: Pt called in wanting to know if she would need a referral to see a Chiropractor or if she could just contact one herself and get set up, please advise. ?

## 2021-04-30 ENCOUNTER — Encounter: Payer: Self-pay | Admitting: Podiatry

## 2021-04-30 ENCOUNTER — Other Ambulatory Visit: Payer: Self-pay | Admitting: Podiatry

## 2021-04-30 DIAGNOSIS — M722 Plantar fascial fibromatosis: Secondary | ICD-10-CM | POA: Diagnosis not present

## 2021-04-30 MED ORDER — OXYCODONE-ACETAMINOPHEN 5-325 MG PO TABS: 1.0000 | ORAL_TABLET | ORAL | 0 refills | Status: DC | PRN

## 2021-04-30 NOTE — Progress Notes (Signed)
PRN postop 

## 2021-05-06 ENCOUNTER — Encounter: Payer: Medicaid Other | Admitting: Podiatry

## 2021-05-06 DIAGNOSIS — G4733 Obstructive sleep apnea (adult) (pediatric): Secondary | ICD-10-CM | POA: Diagnosis not present

## 2021-05-08 ENCOUNTER — Ambulatory Visit (INDEPENDENT_AMBULATORY_CARE_PROVIDER_SITE_OTHER): Payer: Medicaid Other | Admitting: Podiatry

## 2021-05-08 ENCOUNTER — Encounter: Payer: Self-pay | Admitting: Podiatry

## 2021-05-08 ENCOUNTER — Other Ambulatory Visit: Payer: Self-pay

## 2021-05-08 VITALS — BP 116/77 | HR 91 | Temp 98.2°F

## 2021-05-08 DIAGNOSIS — Z9889 Other specified postprocedural states: Secondary | ICD-10-CM

## 2021-05-08 NOTE — Progress Notes (Signed)
? ?  Subjective:  ?Patient presents today status post EPF RT. DOS: 04/30/2021.  Patient states that she is doing well.  She has been weightbearing in the cam boot as instructed.  She is taking Tylenol for pain.  No new complaints at this time ? ?Past Medical History:  ?Diagnosis Date  ? ADD (attention deficit disorder)   ? Allergy   ? Asthma   ? Blood transfusion without reported diagnosis   ? Heart murmur   ? Hypertension   ? Obesity   ? ?  ? ?Objective/Physical Exam ?Neurovascular status intact.  Skin incisions appear to be well coapted with sutures intact. No sign of infectious process noted. No dehiscence. No active bleeding noted.  Minimal edema noted to the surgical extremity. ? ?Assessment: ?1. s/p EPF left. DOS: 04/30/2021 ? ? ?Plan of Care:  ?1. Patient was evaluated. ?3.  Continue weightbearing in the cam boot ?4.  Patient may begin washing and showering and getting the foot wet.  Recommend antibiotic ointment and a Band-Aid to the small incision sites ?5.  Continue Ace wrap daily ?6.  Return to clinic in 1 week for suture removal ? ?*Came in today with her mother ? ?Edrick Kins, DPM ?[place] ? ?Dr. Edrick Kins, DPM  ?  ?[address]                                    ?[place], [place] [address]                ?Office [phone]  ?Fax [phone] ? ? ? ? ? ?

## 2021-05-11 ENCOUNTER — Telehealth: Payer: Self-pay

## 2021-05-11 NOTE — Telephone Encounter (Signed)
Please Review and advise 

## 2021-05-11 NOTE — Telephone Encounter (Signed)
Copied from CRM [id number]. Topic: Referral - Request for Referral ?>> May 11, 2021  1:20 PM Aretta Nip wrote: ?Has patient seen PCP for this complaint? No. ?*If NO, is insurance requiring patient see PCP for this issue before PCP can refer them? ?Referral for which specialty: Chiropractor ? ?Preferred provider/office: None ?Reason for referral: Pt states her back has been hurting and thinks she strained it, offered appt re but stated not hurting now just wants due to her job can be hard on her back. [phone] ?

## 2021-05-12 NOTE — Telephone Encounter (Signed)
Attempted to reach patient with no answer phone continually rings, if patient returns call okay for [facility] nurse triage to advise of PCP message below. KW ?

## 2021-05-12 NOTE — Telephone Encounter (Signed)
Patient called, left VM to return the call to the office for a message from the provider. 

## 2021-05-13 ENCOUNTER — Encounter: Payer: Medicaid Other | Admitting: Podiatry

## 2021-05-13 ENCOUNTER — Encounter: Payer: Self-pay | Admitting: Family Medicine

## 2021-05-13 ENCOUNTER — Other Ambulatory Visit: Payer: Self-pay | Admitting: Family Medicine

## 2021-05-13 ENCOUNTER — Ambulatory Visit: Payer: Self-pay

## 2021-05-13 DIAGNOSIS — G8929 Other chronic pain: Secondary | ICD-10-CM

## 2021-05-13 NOTE — Telephone Encounter (Signed)
Pt called in stating she was wanting to get a referral for chiropractic, she did mention [facility], she is just needing someone who take Medicaid. Please advise.  Pt also sent My-Chart Message.

## 2021-05-13 NOTE — Telephone Encounter (Signed)
?  Chief Complaint: Pt returned our call ?Symptoms:  ?Frequency:  ?Pertinent Negatives: Patient denies  ?Disposition: [] ED /[] Urgent Care (no appt availability in office) / [] Appointment(In office/virtual)/ []  [facility]/ [] Home Care/ [] Refused Recommended Disposition /[]  Mobile Bus/ []  Follow-up with PCP ?Additional Notes: Shared provider note with pt. Pt asked if she needs a referral for Chiropractor. Advised pt to call number on the back of her insurance card for guidance.  ? ? ? ? ? ? ? , CMA ?  ?   1:23 PM ?Note ?Attempted to reach patient with no answer phone continually rings, if patient returns call okay for [facility] nurse triage to advise of PCP message below. KW  ?  ? ?  ? ?   1:22 PM ? , CMA attempted to contact [place], D (No Answer/Busy)   ? , FNP ?to [facility] Nurse   ?  10:13 AM ?I do not have any recommendations for chiropractor; I would recommend 800mg  NSAID, can get OTC, every 8 hours for 3 days, taken with food, and use of ice 20 mins on/20 mins off with stretching. Lack of mobility can worsen.  ?Reason for Disposition ? [1] Other NON-URGENT information for PCP AND [2] does not require PCP response ? ?Answer Assessment - Initial Assessment Questions ?1. REASON FOR CALL or QUESTION: "What is your reason for calling today?" or "How can I best ?help you?" or "What question do you have that I can help answer?" ?    Returning our call ?2. CALLER: Document the source of call. (e.g., laboratory, patient). ?    Pt ? ?Protocols used: PCP Call - No Triage-A-AH ? ?

## 2021-05-13 NOTE — Telephone Encounter (Signed)
Patient called, left VM to return the call to the office to speak to a nurse for a message from the provider. 

## 2021-05-14 ENCOUNTER — Encounter: Payer: Self-pay | Admitting: *Deleted

## 2021-05-14 ENCOUNTER — Telehealth: Payer: Self-pay | Admitting: *Deleted

## 2021-05-14 NOTE — Telephone Encounter (Signed)
"  I had surgery on 04/30/21.  I was trying to get in the shower this morning and fell.  I hurt my foot.  I called out of work today.  I need a note for today."  Do we need to get you in to see someone today?  "I have an appointment scheduled for tomorrow to take the stitches out anyway.  I can just see him then.  I took some of the pain medication that I had left.  I'm okay now, it's just a little sore."  Okay, I'll send you a letter in MyChart or you can pick it up.  "MyChart is fine." ? ?Letter was sent to [facility] via MyChart. ?

## 2021-05-15 ENCOUNTER — Other Ambulatory Visit: Payer: Self-pay

## 2021-05-15 ENCOUNTER — Ambulatory Visit (INDEPENDENT_AMBULATORY_CARE_PROVIDER_SITE_OTHER): Payer: Medicaid Other | Admitting: Podiatry

## 2021-05-15 ENCOUNTER — Encounter: Payer: Self-pay | Admitting: Podiatry

## 2021-05-15 DIAGNOSIS — Z9889 Other specified postprocedural states: Secondary | ICD-10-CM

## 2021-05-15 NOTE — Progress Notes (Signed)
? ?  Subjective:  ?Patient presents today status post EPF RT. DOS: 04/30/2021.  Patient is doing well.  She says that the pain is slowly subsiding.  She continues to take Tylenol for the pain.  She has been weightbearing in the cam boot as instructed.  No new complaints at this time ? ?Past Medical History:  ?Diagnosis Date  ? ADD (attention deficit disorder)   ? Allergy   ? Asthma   ? Blood transfusion without reported diagnosis   ? Heart murmur   ? Hypertension   ? Obesity   ? ?  ? ?Objective/Physical Exam ?Neurovascular status intact.  Skin incisions appear to be well coapted with sutures intact. No sign of infectious process noted. No dehiscence. No active bleeding noted.  Minimal edema noted to the surgical extremity. ? ?Assessment: ?1. s/p EPF left. DOS: 04/30/2021 ? ? ?Plan of Care:  ?1. Patient was evaluated. ?2.  Sutures removed ?3.  Patient may now begin to transition slowly out of the cam boot to good supportive shoes and sneakers ?4.  Compression ankle sleeve dispensed.  Wear daily ?5.  Return to clinic in 4 weeks ? ?*Came in today with her significant other, Ray ? ?Felecia Shelling, DPM ?[facility] ? ?Dr. Felecia Shelling, DPM  ?  ?[address]                                    ?[place], [place] [address]                ?Office [phone]  ?Fax [phone] ? ? ? ? ? ?

## 2021-05-15 NOTE — Telephone Encounter (Signed)
Work note is fine. - Dr. Logan Bores

## 2021-05-26 ENCOUNTER — Encounter: Payer: Medicaid Other | Admitting: Podiatry

## 2021-05-26 ENCOUNTER — Ambulatory Visit: Payer: Medicaid Other | Admitting: Dermatology

## 2021-05-27 ENCOUNTER — Encounter: Payer: Medicaid Other | Admitting: Podiatry

## 2021-06-12 ENCOUNTER — Ambulatory Visit (INDEPENDENT_AMBULATORY_CARE_PROVIDER_SITE_OTHER): Payer: Medicaid Other | Admitting: Podiatry

## 2021-06-12 ENCOUNTER — Ambulatory Visit: Payer: Medicaid Other | Admitting: Internal Medicine

## 2021-06-12 DIAGNOSIS — Z9889 Other specified postprocedural states: Secondary | ICD-10-CM

## 2021-06-12 NOTE — Progress Notes (Signed)
? ?  Subjective:  ?Patient presents today status post EPF RT. DOS: 04/30/2021.  Patient is doing very well.  She says that every day she improves.  She has minimal pain or tenderness associated to the heel.  She is getting back to regular activity with a pain-free foot. ? ?Past Medical History:  ?Diagnosis Date  ? ADD (attention deficit disorder)   ? Allergy   ? Asthma   ? Blood transfusion without reported diagnosis   ? Heart murmur   ? Hypertension   ? Obesity   ? ?  ? ?Objective/Physical Exam ?Neurovascular status intact.  Skin incisions healed.  There is no edema noted.  Negative for any significant tenderness to palpation along the plantar heel ? ?Assessment: ?1. s/p EPF left. DOS: 04/30/2021 ? ? ?Plan of Care:  ?1. Patient was evaluated. ?2.  Overall the patient is doing very well.  She may resume full activity no restrictions ?3.  Recommend good supportive shoes and sneakers ?4.  Return to clinic as needed ? ?*her significant other, Ray ? ?Felecia Shelling, DPM ?[facility] ? ?Dr. Felecia Shelling, DPM  ?  ?[address]                                    ?[place], [place] [address]                ?Office [phone]  ?Fax [phone] ? ? ? ? ? ?

## 2021-07-03 ENCOUNTER — Ambulatory Visit: Payer: Medicaid Other | Admitting: Medical

## 2021-07-07 ENCOUNTER — Ambulatory Visit: Payer: Medicaid Other | Admitting: Medical

## 2021-07-07 NOTE — Progress Notes (Deleted)
Cardiology Office Note:    Date:  07/07/2021   ID:  Amanda [name], DOB 1990/10/18, MRN [id number]  PCP:  Jacky Kindle, FNP  [facility] Cardiologist:  None  [facility] Electrophysiologist:  None   Referring MD: Jacky Kindle, FNP   Chief Complaint: 3 month follow-up  History of Present Illness:    Amanda [name] is a 31 y.o. female with a hx of hypertension, mitral and tricuspid regurgitation, asthma, sleep apnea on CPAP, and ADD who presents with 3 month follow-up.   Seen in 04/2019 for evaluation of chest pain and valvular heart disease. Echo was ordered, but not obtained.   Last seen 03/12/21 for a pre-op visit and was feeling the same. She reported chest tightness and ankle edema. Prior echo by Dr. Welton Flakes showed mild MR and TR. Echo was ordered. Echo showed LVEF 60-65%, no WMA, trivial MR and TR.   Today,   Past Medical History:  Diagnosis Date   ADD (attention deficit disorder)    Allergy    Asthma    Blood transfusion without reported diagnosis    Heart murmur    Hypertension    Obesity     Past Surgical History:  Procedure Laterality Date   FOOT SURGERY     INTRAUTERINE DEVICE (IUD) INSERTION  10/2018   INTRAUTERINE DEVICE INSERTION      Current Medications: No outpatient medications have been marked as taking for the 07/07/21 encounter (Appointment) with Fransico Michael, Lige Lakeman H, PA-C.     Allergies:   Doxycycline   Social History   Socioeconomic History   Marital status: Single    Spouse name: Not on file   Number of children: Not on file   Years of education: Not on file   Highest education level: Not on file  Occupational History   Not on file  Tobacco Use   Smoking status: Former    Packs/day: 0.04    Years: 8.00    Pack years: 0.32    Types: Cigarettes    Quit date: 08/06/2016    Years since quitting: 4.9   Smokeless tobacco: Never  Vaping Use   Vaping Use: Never used  Substance and Sexual Activity   Alcohol use: Yes   Drug use: No    Sexual activity: Yes    Birth control/protection: I.U.D.  Other Topics Concern   Not on file  Social History Narrative   Not on file   Social Determinants of Health   Financial Resource Strain: Not on file  Food Insecurity: Not on file  Transportation Needs: Not on file  Physical Activity: Not on file  Stress: Not on file  Social Connections: Not on file     Family History: The patient's family history includes Asthma in her mother; Cancer in her paternal aunt; Diabetes Mellitus II in her sister; Heart disease in her maternal grandfather; Hypertension in her maternal grandfather.  ROS:   Please see the history of present illness.     All other systems reviewed and are negative.  EKGs/Labs/Other Studies Reviewed:    The following studies were reviewed today:  Echo 03/2021  1. Left ventricular ejection fraction, by estimation, is 60 to 65%. The  left ventricle has normal function. The left ventricle has no regional  wall motion abnormalities. Left ventricular diastolic parameters were  normal.   2. Right ventricular systolic function is normal. The right ventricular  size is normal.   3. The mitral valve is normal in structure. Trivial  mitral valve  regurgitation.   4. The aortic valve is tricuspid. Aortic valve regurgitation is not  visualized.   5. The inferior vena cava is normal in size with greater than 50%  respiratory variability, suggesting right atrial pressure of 3 mmHg.   EKG:  EKG is *** ordered today.  The ekg ordered today demonstrates ***  Recent Labs: 04/09/2021: ALT 18; BUN 8; Creatinine, Ser 0.91; Hemoglobin 13.0; Platelets 317; Potassium 4.1; Sodium 140; TSH 0.724  Recent Lipid Panel    Component Value Date/Time   CHOL 145 04/09/2021 1053   TRIG 64 04/09/2021 1053   HDL 48 04/09/2021 1053   CHOLHDL 3.0 04/09/2021 1053   LDLCALC 84 04/09/2021 1053     Risk Assessment/Calculations:   {Does this patient have ATRIAL  FIBRILLATION?:[phone]}   Physical Exam:    VS:  There were no vitals taken for this visit.    Wt Readings from Last 3 Encounters:  04/09/21 287 lb (130.2 kg)  03/12/21 292 lb 6 oz (132.6 kg)  01/30/21 (!) 305 lb (138.3 kg)     GEN: *** Well nourished, well developed in no acute distress HEENT: Normal NECK: No JVD; No carotid bruits LYMPHATICS: No lymphadenopathy CARDIAC: ***RRR, no murmurs, rubs, gallops RESPIRATORY:  Clear to auscultation without rales, wheezing or rhonchi  ABDOMEN: Soft, non-tender, non-distended MUSCULOSKELETAL:  No edema; No deformity  SKIN: Warm and dry NEUROLOGIC:  Alert and oriented x 3 PSYCHIATRIC:  Normal affect   ASSESSMENT:    No diagnosis found. PLAN:    In order of problems listed above:  DOE/Atypical chest pain  MR/TR  Disposition: Follow up {follow up:15908} with ***   Shared Decision Making/Informed Consent   {Are you ordering a CV Procedure (e.g. stress test, cath, DCCV, TEE, etc)?   Press F2        :[id number]}    Signed, Welborn Keena Ardelle Lesches  07/07/2021 8:33 AM    [facility]

## 2021-07-07 NOTE — Progress Notes (Deleted)
      Established patient visit   Patient: Amanda [name]   DOB: September 09, 1990   30 y.o. Female  MRN: [id number] Visit Date: 07/08/2021  Today's healthcare provider: Jacky Kindle, FNP   Rae Lips as a scribe for Jacky Kindle, FNP.,have documented all relevant documentation on the behalf of Jacky Kindle, FNP,as directed by  Jacky Kindle, FNP while in the presence of Jacky Kindle, FNP.  No chief complaint on file.  Subjective    HPI  Follow up for Obesity  The patient was last seen for this 3 months ago. Changes made at last visit include none, continue Phentermine.  She reports {excellent/good/fair/poor:19665} compliance with treatment. She feels that condition is {improved/worse/unchanged:3041574}. She {is/is not:21021397} having side effects. ***  -----------------------------------------------------------------------------------------   Medications: Outpatient Medications Prior to Visit  Medication Sig   albuterol (VENTOLIN HFA) 108 (90 Base) MCG/ACT inhaler Inhale 1-2 puffs into the lungs every 4 (four) hours as needed for wheezing or shortness of breath.   atomoxetine (STRATTERA) 40 MG capsule Take 1 capsule by mouth daily.   buPROPion (WELLBUTRIN SR) 200 MG 12 hr tablet Take 200 mg by mouth daily.   levocetirizine (XYZAL) 5 MG tablet TAKE 1 TABLET(5 MG) BY MOUTH EVERY EVENING   levonorgestrel (MIRENA) 20 MCG/24HR IUD 1 Intra Uterine Device (1 each total) by Intrauterine route once for 1 dose.   omeprazole (PRILOSEC) 40 MG capsule TAKE 1 CAPSULE(40 MG) BY MOUTH DAILY   oxyCODONE-acetaminophen (PERCOCET) 5-325 MG tablet Take 1 tablet by mouth every 4 (four) hours as needed for severe pain.   phentermine 37.5 MG capsule Take 1 capsule (37.5 mg total) by mouth every morning.   Vitamin D, Ergocalciferol, (DRISDOL) 1.25 MG (50000 UNIT) CAPS capsule Take 1 capsule (50,000 Units total) by mouth every 7 (seven) days. Recommend labs at pick up of refill.   No  facility-administered medications prior to visit.    Review of Systems  {Labs  Heme  Chem  Endocrine  Serology  Results Review (optional):23779}   Objective    There were no vitals taken for this visit. {Show previous vital signs (optional):23777}  Physical Exam  ***  No results found for any visits on 07/08/21.  Assessment & Plan     ***  No follow-ups on file.      {provider attestation***:1}   Jacky Kindle, FNP  [facility] [phone] (phone) [phone] (fax)  [facility]

## 2021-07-08 ENCOUNTER — Encounter: Payer: Self-pay | Admitting: Medical

## 2021-07-08 ENCOUNTER — Ambulatory Visit: Payer: Medicaid Other | Admitting: Family Medicine

## 2021-08-27 ENCOUNTER — Ambulatory Visit: Payer: Medicaid Other | Admitting: Dermatology

## 2021-09-03 ENCOUNTER — Other Ambulatory Visit: Payer: Self-pay | Admitting: Family Medicine

## 2021-09-03 DIAGNOSIS — K219 Gastro-esophageal reflux disease without esophagitis: Secondary | ICD-10-CM

## 2021-11-30 ENCOUNTER — Ambulatory Visit: Payer: Medicaid Other | Attending: Medical | Admitting: Medical

## 2021-11-30 ENCOUNTER — Encounter: Payer: Self-pay | Admitting: Medical

## 2021-11-30 NOTE — Progress Notes (Deleted)
Cardiology Office Note:    Date:  11/30/2021   ID:  Amanda [name], DOB 1990-03-05, MRN [id number]  PCP:  Gwyneth Sprout, FNP  [facility] Cardiologist:  None  [facility] Electrophysiologist:  None   Referring MD: Gwyneth Sprout, FNP   Chief Complaint: 6 month follow-up  History of Present Illness:    Amanda [name] is a 31 y.o. female with a hx of HTN, mitral and tricuspid regurgitation, asthma, OSA s/p CPAP, and ADD who presents for 6 month follow-up.   The patient was last seen 03/12/2021 for preoperative cardiac evaluation for right plantar fasciitis.  She reported sharp radiating chest pain.  Echocardiogram was ordered.  This showed LVEF 60 to 10%, normal diastolic parameters, trivial MR.  Patient was able to proceed with surgery without further cardiac work-up.  Today,  Past Medical History:  Diagnosis Date   ADD (attention deficit disorder)    Allergy    Asthma    Blood transfusion without reported diagnosis    Heart murmur    Hypertension    Obesity     Past Surgical History:  Procedure Laterality Date   FOOT SURGERY     INTRAUTERINE DEVICE (IUD) INSERTION  10/2018   INTRAUTERINE DEVICE INSERTION      Current Medications: No outpatient medications have been marked as taking for the 11/30/21 encounter (Appointment) with Kathlen Mody, Jazmarie Biever H, PA-C.     Allergies:   Doxycycline   Social History   Socioeconomic History   Marital status: Single    Spouse name: Not on file   Number of children: Not on file   Years of education: Not on file   Highest education level: Not on file  Occupational History   Not on file  Tobacco Use   Smoking status: Former    Packs/day: 0.04    Years: 8.00    Total pack years: 0.32    Types: Cigarettes    Quit date: 08/06/2016    Years since quitting: 5.3   Smokeless tobacco: Never  Vaping Use   Vaping Use: Never used  Substance and Sexual Activity   Alcohol use: Yes   Drug use: No   Sexual activity: Yes    Birth  control/protection: I.U.D.  Other Topics Concern   Not on file  Social History Narrative   Not on file   Social Determinants of Health   Financial Resource Strain: Not on file  Food Insecurity: Not on file  Transportation Needs: Not on file  Physical Activity: Not on file  Stress: Not on file  Social Connections: Not on file     Family History: The patient's family history includes Asthma in her mother; Cancer in her paternal aunt; Diabetes Mellitus II in her sister; Heart disease in her maternal grandfather; Hypertension in her maternal grandfather.  ROS:   Please see the history of present illness.     All other systems reviewed and are negative.  EKGs/Labs/Other Studies Reviewed:    The following studies were reviewed today:  Echo 03/2021  1. Left ventricular ejection fraction, by estimation, is 60 to 65%. The  left ventricle has normal function. The left ventricle has no regional  wall motion abnormalities. Left ventricular diastolic parameters were  normal.   2. Right ventricular systolic function is normal. The right ventricular  size is normal.   3. The mitral valve is normal in structure. Trivial mitral valve  regurgitation.   4. The aortic valve is tricuspid. Aortic valve regurgitation  is not  visualized.   5. The inferior vena cava is normal in size with greater than 50%  respiratory variability, suggesting right atrial pressure of 3 mmHg.   EKG:  EKG is *** ordered today.  The ekg ordered today demonstrates ***  Recent Labs: 04/09/2021: ALT 18; BUN 8; Creatinine, Ser 0.91; Hemoglobin 13.0; Platelets 317; Potassium 4.1; Sodium 140; TSH 0.724  Recent Lipid Panel    Component Value Date/Time   CHOL 145 04/09/2021 1053   TRIG 64 04/09/2021 1053   HDL 48 04/09/2021 1053   CHOLHDL 3.0 04/09/2021 1053   LDLCALC 84 04/09/2021 1053     Risk Assessment/Calculations:   {Does this patient have ATRIAL FIBRILLATION?:[id number]}   Physical Exam:    VS:  There  were no vitals taken for this visit.    Wt Readings from Last 3 Encounters:  04/09/21 287 lb (130.2 kg)  03/12/21 292 lb 6 oz (132.6 kg)  01/30/21 (!) 305 lb (138.3 kg)     GEN: *** Well nourished, well developed in no acute distress HEENT: Normal NECK: No JVD; No carotid bruits LYMPHATICS: No lymphadenopathy CARDIAC: ***RRR, no murmurs, rubs, gallops RESPIRATORY:  Clear to auscultation without rales, wheezing or rhonchi  ABDOMEN: Soft, non-tender, non-distended MUSCULOSKELETAL:  No edema; No deformity  SKIN: Warm and dry NEUROLOGIC:  Alert and oriented x 3 PSYCHIATRIC:  Normal affect   ASSESSMENT:    No diagnosis found. PLAN:    In order of problems listed above:  ***  Disposition: Follow up {follow up:15908} with ***   Shared Decision Making/Informed Consent   {Are you ordering a CV Procedure (e.g. stress test, cath, DCCV, TEE, etc)?   Press F2        :[id number]}    Signed, Misa Fedorko David Stall, PA-C  11/30/2021 10:31 AM    [facility]

## 2022-02-19 ENCOUNTER — Other Ambulatory Visit: Payer: Self-pay | Admitting: Family Medicine

## 2022-02-19 ENCOUNTER — Encounter: Payer: Self-pay | Admitting: Family Medicine

## 2022-11-10 ENCOUNTER — Ambulatory Visit: Payer: Medicaid Other | Admitting: Physician Assistant

## 2022-11-11 ENCOUNTER — Ambulatory Visit: Payer: Medicaid Other | Admitting: Physician Assistant

## 2022-12-08 ENCOUNTER — Other Ambulatory Visit: Payer: Self-pay | Admitting: Family Medicine

## 2022-12-08 NOTE — Telephone Encounter (Signed)
Medication Refill - Medication: buPROPion (WELLBUTRIN SR) 200 MG 12 hr tablet   Has the patient contacted their pharmacy? Yes.     (Agent: If yes, when and what did the pharmacy advise?) Contact PCP   Preferred Pharmacy (with phone number or street name): [facility] [address] [place], [place], [place] [address]  Has the patient been seen for an appointment in the last year OR does the patient have an upcoming appointment? No.  Agent: Please be advised that RX refills may take up to 3 business days. We ask that you follow-up with your pharmacy.

## 2022-12-09 NOTE — Telephone Encounter (Signed)
Requested medications are due for refill today.  unsure  Requested medications are on the active medications list.  yes  Last refill. 06/18/2020  Future visit scheduled.   no  Notes to clinic.  Labs are expired. Medication is historical.    Requested Prescriptions  Pending Prescriptions Disp Refills   buPROPion (WELLBUTRIN SR) 200 MG 12 hr tablet      Sig: Take 1 tablet (200 mg total) by mouth daily.     Psychiatry: Antidepressants - bupropion Failed - 12/08/2022  5:10 PM      Failed - Cr in normal range and within 360 days    Creatinine, Ser  Date Value Ref Range Status  04/09/2021 0.91 0.57 - 1.00 mg/dL Final         Failed - AST in normal range and within 360 days    AST  Date Value Ref Range Status  04/09/2021 14 0 - 40 IU/L Final         Failed - ALT in normal range and within 360 days    ALT  Date Value Ref Range Status  04/09/2021 18 0 - 32 IU/L Final         Failed - Valid encounter within last 6 months    Recent Outpatient Visits           1 year ago Preoperative clearance   [facility] Jacky Kindle, FNP   1 year ago Morbid obesity [facility])   [facility] Merita Norton T, FNP   2 years ago Snoring   [facility] Chrismon, Jodell Cipro, PA-C   2 years ago Urinary frequency   [facility] Just, Azalee Course, FNP   2 years ago Snoring   [facility] Joycelyn Man M, [place]              Passed - Last BP in normal range    BP Readings from Last 1 Encounters:  05/08/21 116/77

## 2022-12-11 NOTE — Progress Notes (Unsigned)
MyChart Video Visit    Virtual Visit via Video Note   This format is felt to be most appropriate for this patient at this time. Physical exam was limited by quality of the video and audio technology used for the visit.   Patient location: Patient's home address   Provider location: [facility]  [address], [address]  [place], [place] [address]   I discussed the limitations of evaluation and management by telemedicine and the availability of in person appointments. The patient expressed understanding and agreed to proceed.  Patient: Amanda [name]   DOB: 06-Sep-1990   32 y.o. Female  MRN: [id number] Visit Date: 12/13/2022  Today's healthcare provider: Ronnald Ramp, MD   No chief complaint on file.  Subjective    HPI   Discussed the use of AI scribe software for clinical note transcription with the patient, who gave verbal consent to proceed.  History of Present Illness              Past Medical History:  Diagnosis Date   ADD (attention deficit disorder)    Allergy    Asthma    Blood transfusion without reported diagnosis    Heart murmur    Hypertension    Obesity     Medications: Outpatient Medications Prior to Visit  Medication Sig   albuterol (VENTOLIN HFA) 108 (90 Base) MCG/ACT inhaler Inhale 1-2 puffs into the lungs every 4 (four) hours as needed for wheezing or shortness of breath.   atomoxetine (STRATTERA) 40 MG capsule Take 1 capsule by mouth daily.   buPROPion (WELLBUTRIN SR) 200 MG 12 hr tablet Take 200 mg by mouth daily.   levocetirizine (XYZAL) 5 MG tablet TAKE 1 TABLET(5 MG) BY MOUTH EVERY EVENING   levonorgestrel (MIRENA) 20 MCG/24HR IUD 1 Intra Uterine Device (1 each total) by Intrauterine route once for 1 dose.   omeprazole (PRILOSEC) 40 MG capsule TAKE 1 CAPSULE(40 MG) BY MOUTH DAILY   oxyCODONE-acetaminophen (PERCOCET) 5-325 MG tablet Take 1 tablet by mouth every 4 (four) hours as needed for severe pain.    phentermine 37.5 MG capsule Take 1 capsule (37.5 mg total) by mouth every morning.   Vitamin D, Ergocalciferol, (DRISDOL) 1.25 MG (50000 UNIT) CAPS capsule Take 1 capsule (50,000 Units total) by mouth every 7 (seven) days. Recommend labs at pick up of refill.   No facility-administered medications prior to visit.    Review of Systems  {Insert previous labs (optional):23779} {See past labs  Heme  Chem  Endocrine  Serology  Results Review (optional):1}   Objective    There were no vitals taken for this visit.  {Insert last BP/Wt (optional):23777}{See vitals history (optional):1}    Physical Exam     Assessment & Plan     Problem List Items Addressed This Visit   None   Assessment and Plan              No follow-ups on file.     I discussed the assessment and treatment plan with the patient. The patient was provided an opportunity to ask questions and all were answered. The patient agreed with the plan and demonstrated an understanding of the instructions.   The patient was advised to call back or seek an in-person evaluation if the symptoms worsen or if the condition fails to improve as anticipated.  I provided *** minutes of non-face-to-face time during this encounter.   Ronnald Ramp, MD [facility] [phone] (phone) [phone] (  fax)  [facility]

## 2022-12-13 ENCOUNTER — Telehealth (INDEPENDENT_AMBULATORY_CARE_PROVIDER_SITE_OTHER): Payer: Medicaid Other | Admitting: Family Medicine

## 2022-12-13 ENCOUNTER — Encounter: Payer: Self-pay | Admitting: Family Medicine

## 2022-12-13 DIAGNOSIS — K219 Gastro-esophageal reflux disease without esophagitis: Secondary | ICD-10-CM

## 2022-12-13 DIAGNOSIS — R7989 Other specified abnormal findings of blood chemistry: Secondary | ICD-10-CM | POA: Diagnosis not present

## 2022-12-13 DIAGNOSIS — F900 Attention-deficit hyperactivity disorder, predominantly inattentive type: Secondary | ICD-10-CM | POA: Diagnosis not present

## 2022-12-13 DIAGNOSIS — E559 Vitamin D deficiency, unspecified: Secondary | ICD-10-CM

## 2022-12-13 MED ORDER — BUPROPION HCL ER (XL) 300 MG PO TB24: 300.0000 mg | ORAL_TABLET | Freq: Every day | ORAL | 1 refills | Status: DC

## 2022-12-13 MED ORDER — FAMOTIDINE 40 MG PO TABS: 40.0000 mg | ORAL_TABLET | Freq: Every day | ORAL | 1 refills | Status: DC

## 2022-12-28 ENCOUNTER — Ambulatory Visit (INDEPENDENT_AMBULATORY_CARE_PROVIDER_SITE_OTHER): Payer: Medicaid Other | Admitting: Family Medicine

## 2022-12-28 DIAGNOSIS — Z91199 Patient's noncompliance with other medical treatment and regimen due to unspecified reason: Secondary | ICD-10-CM

## 2022-12-28 NOTE — Progress Notes (Signed)
Patient was not seen for appt d/t no call, no show, or late arrival >10 mins past appt time. Additional appt scheduled for 11/14.  Jacky Kindle, FNP  [facility] [address] [place], [place] [address] [phone] (phone) [phone] (fax) [facility]

## 2023-01-06 ENCOUNTER — Ambulatory Visit: Payer: Medicaid Other | Admitting: Family Medicine

## 2023-01-06 DIAGNOSIS — Z91199 Patient's noncompliance with other medical treatment and regimen due to unspecified reason: Secondary | ICD-10-CM

## 2023-01-06 NOTE — Progress Notes (Signed)
Patient was not seen for appt d/t no call, no show, or late arrival >10 mins past appt time.   Elise T Payne, FNP  [facility] [address] [place], [place] [address] [phone] (phone) [phone] (fax) [facility]  

## 2023-01-26 ENCOUNTER — Telehealth: Payer: Self-pay | Admitting: Internal Medicine

## 2023-01-26 NOTE — Telephone Encounter (Signed)
I thought the office said she is just getting a teeth cleaning and x-rays. Are they anticipating she is going to need another procedure? Teeth cleanings and even simple teeth extraction are generally low risk and do not require any specific cardiac clearance. However, we have not seen her in almost 2 years so we cannot provide any formal risk assessment or recommendations regarding any potential procedures without an office visit. So I think we just need to arrange an in-office visit. Can you please help arrange this?  I will remove from pre-op pool.   Thanks! Dionis Autry

## 2023-01-26 NOTE — Telephone Encounter (Signed)
Per DDS office the pt is having cleaning, scaling, dental exam and x-rays. Pt is not having any teeth extracted at this time. Marcelle Smiling states there were some questions on their form the DDS has asked about. I asked Marcelle Smiling if she can re-fax the request to our [phone] attn: prop team so that I may confirm all the information. Marcelle Smiling said she will fax right over to [phone].

## 2023-01-26 NOTE — Telephone Encounter (Signed)
To clarify the procedure to be done at this time:   Dental cleaning, scaling, x-rays and dental exam.   I only added the questions that were on the form as it was stated to me when I s/w the office, the questions were only added as FYI as to the complete form from the DDS.    Pt has been scheduled with Cadence Fransico Michael, [facility] 03/03/23 @ 1:30 pm. I will update all parties involved.

## 2023-01-26 NOTE — Telephone Encounter (Signed)
I received the fax from the DDS and confirmed information. In regard to the questions Dr. Linna Caprice,  DDS has are listed on their dental clearance form:   Specific areas of concern:   INDICATE WHETHER  OR NOT PROCEDURES WILL BE FINE TO PERFORM ACCORDING TO PATIENT'S HEALTH HISTORY-YES OR NO  ARE THERE ANY ALTERATIONS NECESSARY FOR PT'S MEDICATION PRIOR TO Tx-YES OR NO  IS IV SEDATION OR ORAL SEDATION AN OPTION FOR ORAL SURGERY-YES OR NO  WILL A PRE-MED BE NECESSARY, AND IF SO WHAT KIND IS BEST FOR THE PT-YES OR NO  HE /SHE HAS NO PENDING DENTAL Tx AND HE/SHE IS CLEAR TO F/U WITH YOUR MEDICAL ADVICE/Tx-YES OR NO

## 2023-01-26 NOTE — Telephone Encounter (Signed)
   Name: Amanda [name]  DOB: Dec 09, 1990  MRN: [id number]  Primary Cardiologist: Dr. Okey Dupre  Chart reviewed as part of pre-operative protocol coverage. Patient has not been seen by our office since 02/2021. Because of Amanda [name]'s past medical history and time since last visit, she will require a follow-up in-office visit in order to better assess preoperative cardiovascular risk. Can you also please get clarification from dentist office about how many teeth will be extracted?  Pre-op covering staff: - Please schedule appointment and call patient to inform them. If patient already had an upcoming appointment within acceptable timeframe, please add "pre-op clearance" to the appointment notes so provider is aware. - Please contact requesting surgeon's office via preferred method (i.e, phone, fax) to inform them of need for appointment prior to surgery.  Corrin Parker, PA-C  01/26/2023, 3:42 PM

## 2023-01-26 NOTE — Telephone Encounter (Signed)
   Pre-operative Risk Assessment    Patient Name: Amanda [name]  DOB: Jul 25, 1990 MRN: [id number]     Request for Surgical Clearance    Procedure:  Dental Extraction - Amount of Teeth to be Pulled:  N/A  Date of Surgery:  Clearance TBD                                 Surgeon:  Dr. Olen Cordial DDS Surgeon's Group or Practice Name:  [facility] Phone number:  [phone] Fax number:  [phone]   Type of Clearance Requested:   Medical   Type of Anesthesia:  Not Indicated   Additional requests/questions:    SignedMaceo [employer]   01/26/2023, 3:22 PM

## 2023-03-02 NOTE — Progress Notes (Signed)
 Cardiology Office Note:    Date:  03/03/2023   ID:  Amanda [name], DOB 30-Jun-1990, MRN [id number]  PCP:  Emilio Kelly DASEN, FNP  [facility] Cardiologist:  None  [facility] Electrophysiologist:  None   Referring MD: Emilio Kelly DASEN, FNP   Chief Complaint: pre-op visit  History of Present Illness:    Amanda [name] is a 33 y.o. female with a hx of hypertension, mitral and tricuspid regurgitation, asthma, sleep apnea on CPAP, and ADD who presents for follow-up.  Patient was seen in 2021 for chest pain and valvular heart disease.  An echocardiogram was ordered however she did not follow-up with this.  She was last seen in 2023 for preoperative evaluation.  She was reporting chest pain and back pain.  Suspected deconditioning and weight contributing to symptoms.  Echo showed EF 60 to 65%, trivial MR.  Today, the patient is overall doing well. She used to have sharp chest pain, but has not had it lately.  She has asthma, which can cause SOB. She decreased smoking, now 1-2 times monthly. No lower leg edema. Has occasional orthostatic symptoms. She feels fatigue. She uses CPAP nightly. She has been more active, trying to do more exercise. No significant palpitations or HR. She is going to have dental cleaning and possible surgery due to whole in her tooth. Dentist wanted cardiology eval prior to this.   Past Medical History:  Diagnosis Date   ADD (attention deficit disorder)    Allergy    Asthma    Blood transfusion without reported diagnosis    Heart murmur    Hypertension    Obesity     Past Surgical History:  Procedure Laterality Date   FOOT SURGERY     INTRAUTERINE DEVICE (IUD) INSERTION  10/2018   INTRAUTERINE DEVICE INSERTION      Current Medications: Current Meds  Medication Sig   buPROPion  (WELLBUTRIN  XL) 300 MG 24 hr tablet Take 1 tablet (300 mg total) by mouth daily.   famotidine  (PEPCID ) 40 MG tablet Take 1 tablet (40 mg total) by mouth daily.   levonorgestrel   (MIRENA ) 20 MCG/24HR IUD 1 Intra Uterine Device (1 each total) by Intrauterine route once for 1 dose.     Allergies:   Doxycycline    Social History   Socioeconomic History   Marital status: Single    Spouse name: Not on file   Number of children: Not on file   Years of education: Not on file   Highest education level: Not on file  Occupational History   Not on file  Tobacco Use   Smoking status: Former    Current packs/day: 0.00    Average packs/day: (0.3 ttl pk-yrs)    Types: Cigarettes    Start date: 08/06/2008    Quit date: 08/06/2016    Years since quitting: 6.5   Smokeless tobacco: Never  Vaping Use   Vaping status: Never Used  Substance and Sexual Activity   Alcohol use: Yes   Drug use: No   Sexual activity: Yes    Birth control/protection: I.U.D.  Other Topics Concern   Not on file  Social History Narrative   Not on file   Social Drivers of Health   Financial Resource Strain: Not on file  Food Insecurity: Not on file  Transportation Needs: Not on file  Physical Activity: Not on file  Stress: Not on file  Social Connections: Not on file     Family History: The patient's family history includes  Asthma in her mother; Cancer in her paternal aunt; Diabetes Mellitus II in her sister; Heart disease in her maternal grandfather; Hypertension in her maternal grandfather.  ROS:   Please see the history of present illness.     All other systems reviewed and are negative.  EKGs/Labs/Other Studies Reviewed:    The following studies were reviewed today:  Echo 03/2021  1. Left ventricular ejection fraction, by estimation, is 60 to 65%. The  left ventricle has normal function. The left ventricle has no regional  wall motion abnormalities. Left ventricular diastolic parameters were  normal.   2. Right ventricular systolic function is normal. The right ventricular  size is normal.   3. The mitral valve is normal in structure. Trivial mitral valve  regurgitation.    4. The aortic valve is tricuspid. Aortic valve regurgitation is not  visualized.   5. The inferior vena cava is normal in size with greater than 50%  respiratory variability, suggesting right atrial pressure of 3 mmHg.   EKG:  EKG is ordered today.  The ekg ordered today demonstrates NSR 70bpm, TWI V1 and V2.  Recent Labs: No results found for requested labs within last 365 days.  Recent Lipid Panel    Component Value Date/Time   CHOL 145 04/09/2021 1053   TRIG 64 04/09/2021 1053   HDL 48 04/09/2021 1053   CHOLHDL 3.0 04/09/2021 1053   LDLCALC 84 04/09/2021 1053     Physical Exam:    VS:  BP 100/70   Pulse 70   Ht 5' 5 (1.651 m)   Wt 263 lb 6.4 oz (119.5 kg)   SpO2 99%   BMI 43.83 kg/m     Wt Readings from Last 3 Encounters:  03/03/23 263 lb 6.4 oz (119.5 kg)  04/09/21 287 lb (130.2 kg)  03/12/21 292 lb 6 oz (132.6 kg)     GEN:  Well nourished, well developed in no acute distress HEENT: Normal NECK: No JVD; No carotid bruits LYMPHATICS: No lymphadenopathy CARDIAC: RRR, no murmurs, rubs, gallops RESPIRATORY:  Clear to auscultation without rales, wheezing or rhonchi  ABDOMEN: Soft, non-tender, non-distended MUSCULOSKELETAL:  No edema; No deformity  SKIN: Warm and dry NEUROLOGIC:  Alert and oriented x 3 PSYCHIATRIC:  Normal affect   ASSESSMENT:    1. Preop cardiovascular exam   2. Mitral valve insufficiency, unspecified etiology   3. Atypical chest pain    PLAN:    In order of problems listed above:  Pre-op evaluation Valve disease Atypical chest pain Patient is needing a dental cleaning with possible surgery in the future.  Dentist required cardiac evaluation prior to possible surgery.  Patient has a history of atypical chest pain and valvular disease.  Echo in 2023 showed normal pump function with trivial valve disease.  May need follow up echo in 3 to 5 years.  Patient denies any significant chest pain at this time.  Breathing has gotten better.  She has  been more active and lost about 50 pounds.  EKG today shows normal sinus rhythm with no ischemic changes.  Vital stable.  Patient does need updated labs. METs greater than 4.  Patient is overall low risk for dental surgery/procedure.  No further cardiac workup prior to surgery.  Disposition: Follow up in 1 year(s) with MD/APP    Signed, Ida Milbrath VEAR Fishman, PA-C  03/03/2023 2:38 PM    [facility]

## 2023-03-03 ENCOUNTER — Encounter: Payer: Self-pay | Admitting: Medical

## 2023-03-03 ENCOUNTER — Ambulatory Visit: Payer: Medicaid Other | Attending: Medical | Admitting: Medical

## 2023-03-03 VITALS — BP 100/70 | HR 70 | Ht 65.0 in | Wt 263.4 lb

## 2023-03-03 DIAGNOSIS — I340 Nonrheumatic mitral (valve) insufficiency: Secondary | ICD-10-CM | POA: Diagnosis not present

## 2023-03-03 DIAGNOSIS — R0789 Other chest pain: Secondary | ICD-10-CM

## 2023-03-03 DIAGNOSIS — Z01810 Encounter for preprocedural cardiovascular examination: Secondary | ICD-10-CM | POA: Diagnosis not present

## 2023-03-03 NOTE — Patient Instructions (Signed)
 Medication Instructions:  Your Physician recommend you continue on your current medication as directed.    *If you need a refill on your cardiac medications before your next appointment, please call your pharmacy*   Lab Work: None ordered at this time  If you have labs (blood work) drawn today and your tests are completely normal, you will receive your results only by: MyChart Message (if you have MyChart) OR A paper copy in the mail If you have any lab test that is abnormal or we need to change your treatment, we will call you to review the results.   Follow-Up: At [facility], you and your health needs are our priority.  As part of our continuing mission to provide you with exceptional heart care, we have created designated Provider Care Teams.  These Care Teams include your primary Cardiologist (physician) and Advanced Practice Providers (APPs -  Physician Assistants and Nurse Practitioners) who all work together to provide you with the care you need, when you need it.  We recommend signing up for the patient portal called MyChart.  Sign up information is provided on this After Visit Summary.  MyChart is used to connect with patients for Virtual Visits (Telemedicine).  Patients are able to view lab/test results, encounter notes, upcoming appointments, etc.  Non-urgent messages can be sent to your provider as well.   To learn more about what you can do with MyChart, go to [email or web address].    Your next appointment:   1 year(s)  Provider:   You may see  or one of the following Advanced Practice Providers on your designated Care Team:   Lonni Meager, NP Bernardino Bring, PA-C Cadence Franchester, PA-C Tylene Lunch, NP Barnie Hila, NP

## 2023-04-13 ENCOUNTER — Telehealth: Payer: Self-pay | Admitting: Internal Medicine

## 2023-04-13 NOTE — Telephone Encounter (Signed)
   Patient Name: Amanda [name]  DOB: 13-May-1990 MRN: [id number]  Primary Cardiologist: None  Chart reviewed as part of pre-operative protocol coverage. Pre-op clearance already addressed by colleagues in earlier phone notes. To summarize recommendations:  Please see office note by Cadence Furth, PA-C from 03/03/2023:  -Pre-op evaluation Valve disease Atypical chest pain Patient is needing a dental cleaning with possible surgery in the future.  Dentist required cardiac evaluation prior to possible surgery.  Patient has a history of atypical chest pain and valvular disease.  Echo in 2023 showed normal pump function with trivial valve disease.  May need follow up echo in 3 to 5 years.  Patient denies any significant chest pain at this time.  Breathing has gotten better.  She has been more active and lost about 50 pounds.  EKG today shows normal sinus rhythm with no ischemic changes.  Vital stable.  Patient does need updated labs. METs greater than 4.  Patient is overall low risk for dental surgery/procedure.  No further cardiac workup prior to surgery.    Will route this bundled recommendation to requesting provider via Epic fax function and remove from pre-op pool. Please call with questions.  Sharlene Dory, PA-C 04/13/2023, 9:40 AM

## 2023-04-13 NOTE — Telephone Encounter (Signed)
 Pt states that when she to dental appt they realized that he clearance was not updated, requesting cb to discuss

## 2023-05-26 ENCOUNTER — Ambulatory Visit: Admitting: Physician Assistant

## 2023-05-26 ENCOUNTER — Encounter: Payer: Self-pay | Admitting: Physician Assistant

## 2023-05-26 VITALS — BP 103/63 | HR 87 | Resp 16 | Ht 65.0 in | Wt 262.1 lb

## 2023-05-26 DIAGNOSIS — R5383 Other fatigue: Secondary | ICD-10-CM

## 2023-05-26 DIAGNOSIS — F17200 Nicotine dependence, unspecified, uncomplicated: Secondary | ICD-10-CM | POA: Diagnosis not present

## 2023-05-26 DIAGNOSIS — G4739 Other sleep apnea: Secondary | ICD-10-CM | POA: Insufficient documentation

## 2023-05-26 DIAGNOSIS — J4520 Mild intermittent asthma, uncomplicated: Secondary | ICD-10-CM

## 2023-05-26 DIAGNOSIS — Z0001 Encounter for general adult medical examination with abnormal findings: Secondary | ICD-10-CM | POA: Diagnosis not present

## 2023-05-26 DIAGNOSIS — E559 Vitamin D deficiency, unspecified: Secondary | ICD-10-CM | POA: Diagnosis not present

## 2023-05-26 DIAGNOSIS — R7303 Prediabetes: Secondary | ICD-10-CM | POA: Insufficient documentation

## 2023-05-26 DIAGNOSIS — F419 Anxiety disorder, unspecified: Secondary | ICD-10-CM

## 2023-05-26 DIAGNOSIS — F900 Attention-deficit hyperactivity disorder, predominantly inattentive type: Secondary | ICD-10-CM

## 2023-05-26 DIAGNOSIS — R222 Localized swelling, mass and lump, trunk: Secondary | ICD-10-CM

## 2023-05-26 DIAGNOSIS — J301 Allergic rhinitis due to pollen: Secondary | ICD-10-CM

## 2023-05-26 DIAGNOSIS — Z0000 Encounter for general adult medical examination without abnormal findings: Secondary | ICD-10-CM

## 2023-05-26 MED ORDER — ALBUTEROL SULFATE HFA 108 (90 BASE) MCG/ACT IN AERS: 1.0000 | INHALATION_SPRAY | RESPIRATORY_TRACT | 0 refills | Status: AC | PRN

## 2023-05-26 MED ORDER — LEVOCETIRIZINE DIHYDROCHLORIDE 5 MG PO TABS: ORAL_TABLET | ORAL | 1 refills | Status: DC

## 2023-05-26 NOTE — Progress Notes (Signed)
 Complete physical exam  Patient: Amanda [name]   DOB: 1991-02-17   33 y.o. Female  MRN: [id number] Visit Date: 05/26/2023  Today's healthcare provider: Debera Lat, PA-C   Chief Complaint  Patient presents with   Annual Exam    CPE with Vitamin B and D labs . She also wants her back looked @ due to a small lump,    Subjective    Amanda [name] is a 33 y.o. female who presents today for a complete physical exam.   Discussed the use of AI scribe software for clinical note transcription with the patient, who gave verbal consent to proceed.  History of Present Illness The patient, with a history of depression, allergies, sleep apnea, asthma, and a crooked spine, presents with multiple concerns. The patient reports managing depression with the help of a therapist and medications including Vyvanse and Wellbutrin. The patient also mentions having allergies, which have been ongoing for several months, causing nasal congestion and mucus production. The patient has been using Xyzal and has tried Flonase in the past.  The patient also reports fatigue, which she attributes to low vitamin D levels. She has been taking vitamin D supplements but is unsure of the dosage. The patient also mentions a concern about weight management.  The patient has noticed a lump on her back for several months. The lump was previously diagnosed as a complete muscle tear by an urgent care doctor, but the patient is seeking a second opinion as she does not recall any traumatic event that could have caused a muscle tear. The patient also reports side pain, which she attributes to a recent UTI for which she is taking antibiotics.  The patient also has a history of sleep apnea and uses a CPAP machine. Despite this, she still experiences fatigue. The patient also has asthma but does not currently have a prescription for albuterol.   Last depression screening scores    04/09/2021   10:17 AM 01/30/2021    9:40 AM  05/14/2020    9:44 AM  PHQ 2/9 Scores  PHQ - 2 Score 0 4 0  PHQ- 9 Score 0 8 8   Last fall risk screening    04/09/2021   10:17 AM  Fall Risk   Falls in the past year? 0   Last Audit-C alcohol use screening    04/09/2021   10:16 AM  Alcohol Use Disorder Test (AUDIT)  1. How often do you have a drink containing alcohol? 2  2. How many drinks containing alcohol do you have on a typical day when you are drinking? 0  3. How often do you have six or more drinks on one occasion? 0  AUDIT-C Score 2   A score of 3 or more in women, and 4 or more in men indicates increased risk for alcohol abuse, EXCEPT if all of the points are from question 1   Past Medical History:  Diagnosis Date   ADD (attention deficit disorder)    Allergy    Asthma    Blood transfusion without reported diagnosis    Heart murmur    Hypertension    Obesity    Past Surgical History:  Procedure Laterality Date   FOOT SURGERY     INTRAUTERINE DEVICE (IUD) INSERTION  10/2018   INTRAUTERINE DEVICE INSERTION     Social History   Socioeconomic History   Marital status: Single    Spouse name: Not on file   Number  of children: Not on file   Years of education: Not on file   Highest education level: Not on file  Occupational History   Not on file  Tobacco Use   Smoking status: Former    Current packs/day: 0.00    Average packs/day: (0.3 ttl pk-yrs)    Types: Cigarettes    Start date: 08/06/2008    Quit date: 08/06/2016    Years since quitting: 6.8   Smokeless tobacco: Never  Vaping Use   Vaping status: Never Used  Substance and Sexual Activity   Alcohol use: Yes   Drug use: No   Sexual activity: Yes    Birth control/protection: I.U.D.  Other Topics Concern   Not on file  Social History Narrative   Not on file   Social Drivers of Health   Financial Resource Strain: Not on file  Food Insecurity: Not on file  Transportation Needs: Not on file  Physical Activity: Not on file  Stress: Not on file   Social Connections: Not on file  Intimate Partner Violence: Not on file   Family Status  Relation Name Status   Emelda Brothers  (Not Specified)   Mother  Alive   Father  Alive   Brother  Alive   Brother  Alive   Sister  (Not Specified)   MGF  (Not Specified)  No partnership data on file   Family History  Problem Relation Age of Onset   Cancer Paternal Aunt        Breast Cancer   Asthma Mother    Diabetes Mellitus II Sister    Hypertension Maternal Grandfather    Heart disease Maternal Grandfather    Allergies  Allergen Reactions   Doxycycline Nausea And Vomiting    Patient Care Team: Debera Lat, PA-C as PCP - General (Physician Assistant) End, Cristal Deer, MD as Consulting Physician (Cardiology)   Medications: Outpatient Medications Prior to Visit  Medication Sig   buPROPion (WELLBUTRIN XL) 300 MG 24 hr tablet Take 1 tablet (300 mg total) by mouth daily.   famotidine (PEPCID) 40 MG tablet Take 1 tablet (40 mg total) by mouth daily.   levonorgestrel (MIRENA) 20 MCG/24HR IUD 1 Intra Uterine Device (1 each total) by Intrauterine route once for 1 dose.   [DISCONTINUED] albuterol (VENTOLIN HFA) 108 (90 Base) MCG/ACT inhaler Inhale 1-2 puffs into the lungs every 4 (four) hours as needed for wheezing or shortness of breath. (Patient not taking: Reported on 03/03/2023)   [DISCONTINUED] atomoxetine (STRATTERA) 40 MG capsule Take 1 capsule by mouth daily. (Patient not taking: Reported on 03/03/2023)   [DISCONTINUED] levocetirizine (XYZAL) 5 MG tablet TAKE 1 TABLET(5 MG) BY MOUTH EVERY EVENING (Patient not taking: Reported on 03/03/2023)   [DISCONTINUED] omeprazole (PRILOSEC) 40 MG capsule TAKE 1 CAPSULE(40 MG) BY MOUTH DAILY (Patient not taking: Reported on 03/03/2023)   [DISCONTINUED] Vitamin D, Ergocalciferol, (DRISDOL) 1.25 MG (50000 UNIT) CAPS capsule Take 1 capsule (50,000 Units total) by mouth every 7 (seven) days. Recommend labs at pick up of refill. (Patient not taking: Reported on  03/03/2023)   No facility-administered medications prior to visit.    Review of Systems  All other systems reviewed and are negative.  Except see HPI     Objective    BP 103/63 (BP Location: Right Arm, Patient Position: Sitting, Cuff Size: Normal)   Pulse 87   Resp 16   Ht 5\' 5"  (1.651 m)   Wt 262 lb 1.6 oz (118.9 kg)   SpO2 97%  BMI 43.62 kg/m      Physical Exam Vitals reviewed.  Constitutional:      General: She is not in acute distress.    Appearance: Normal appearance. She is well-developed. She is not ill-appearing, toxic-appearing or diaphoretic.  HENT:     Head: Normocephalic and atraumatic.     Right Ear: Tympanic membrane, ear canal and external ear normal.     Left Ear: Tympanic membrane, ear canal and external ear normal.     Nose: Nose normal. No congestion or rhinorrhea.     Mouth/Throat:     Mouth: Mucous membranes are moist.     Pharynx: Oropharynx is clear. No oropharyngeal exudate.  Eyes:     General: No scleral icterus.       Right eye: No discharge.        Left eye: No discharge.     Conjunctiva/sclera: Conjunctivae normal.     Pupils: Pupils are equal, round, and reactive to light.  Neck:     Thyroid: No thyromegaly.     Vascular: No carotid bruit.  Cardiovascular:     Rate and Rhythm: Normal rate and regular rhythm.     Pulses: Normal pulses.     Heart sounds: Normal heart sounds. No murmur heard.    No friction rub. No gallop.  Pulmonary:     Effort: Pulmonary effort is normal. No respiratory distress.     Breath sounds: Normal breath sounds. No wheezing or rales.  Abdominal:     General: Abdomen is flat. Bowel sounds are normal. There is no distension.     Palpations: Abdomen is soft. There is no mass.     Tenderness: There is no abdominal tenderness. There is no right CVA tenderness, left CVA tenderness, guarding or rebound.     Hernia: No hernia is present.  Musculoskeletal:        General: No swelling, tenderness, deformity or  signs of injury. Normal range of motion.     Cervical back: Normal range of motion and neck supple. No rigidity or tenderness.     Right lower leg: No edema.     Left lower leg: No edema.  Lymphadenopathy:     Cervical: No cervical adenopathy.  Skin:    General: Skin is warm and dry.     Coloration: Skin is not jaundiced or pale.     Findings: No bruising, erythema, lesion or rash.  Neurological:     Mental Status: She is alert and oriented to person, place, and time. Mental status is at baseline.     Gait: Gait normal.  Psychiatric:        Mood and Affect: Mood normal.        Behavior: Behavior normal.        Thought Content: Thought content normal.        Judgment: Judgment normal.    Deferred breast and pelvic exams  No results found for any visits on 05/26/23.  Assessment & Plan    Routine Health Maintenance and Physical Exam  Exercise Activities and Dietary recommendations  Goals   None     Immunization History  Administered Date(s) Administered   Influenza,inj,Quad PF,6+ Mos 12/02/2017, 01/15/2020   Influenza-Unspecified 12/02/2017   MMR 12/22/1989, 07/16/1993   Moderna Sars-Covid-2 Vaccination 12/30/2019, 01/28/2020   Tdap 02/08/2007, 01/15/2020   Varicella 01/28/2020, 03/13/2020    Health Maintenance  Topic Date Due   Pneumococcal Vaccination (1 of 2 - PCV) Never done   Pap with HPV screening  06/03/2020   Flu Shot  09/23/2023   DTaP/Tdap/Td vaccine (3 - Td or Tdap) 01/14/2030   Hepatitis C Screening  Completed   HIV Screening  Completed   HPV Vaccine  Aged Out   COVID-19 Vaccine  Discontinued    Discussed health benefits of physical activity, and encouraged her to engage in regular exercise appropriate for her age and condition.  Assessment & Plan  Vitamin D deficiency - VITAMIN D 25 Hydroxy (Vit-D Deficiency, Fractures)  Morbid obesity (HCC)   Chronic and stable Body mass index is 43.62 kg/m. Weight loss of 5% of pt's current weight via  healthy diet and daily exercise up to 150' encouraged. workup - Lipid panel - Hemoglobin A1c - TSH - Comprehensive metabolic panel with GFR - CBC with Differential/Platelet Will reassess after  receiving lab results  Tobacco use disorder Vaping, using marijuana Cessation advised Will revisit at the next appointment  Attention deficit hyperactivity disorder (ADHD), predominantly inattentive type  Annual physical exam Needs to update dental/eye exams Things to do to keep yourself healthy  - Exercise at least 30-45 minutes a day, 3-4 days a week.  - Eat a low-fat diet with lots of fruits and vegetables, up to 7-9 servings per day.  - Seatbelts can save your life. Wear them always.  - Smoke detectors on every level of your home, check batteries every year.  - Eye Doctor - have an eye exam every 1-2 years  - Safe sex - if you may be exposed to STDs, use a condom.  - Alcohol -  If you drink, do it moderately, less than 2 drinks per day.  - Depression is common in our stressful world.If you're feeling down or losing interest in things you normally enjoy, please come in for a visit.  - Violence - If anyone is threatening or hurting you, please call immediately.  Other fatigue Chronic Initial workup - Lipid panel - Hemoglobin A1c - TSH - Comprehensive metabolic panel with GFR - CBC with Differential/Platelet - Vitamin B12 - VITAMIN D 25 Hydroxy (Vit-D Deficiency, Fractures) - Vitamin B12 Will follow-up   Other sleep apnea Chronic and controlled Currently using CPAP machine.    Non-seasonal allergic rhinitis due to pollen  - albuterol (VENTOLIN HFA) 108 (90 Base) MCG/ACT inhaler; Inhale 1-2 puffs into the lungs every 4 (four) hours as needed for wheezing or shortness of breath.  Dispense: 18 g; Refill: 0 - levocetirizine (XYZAL) 5 MG tablet; TAKE 1 TABLET(5 MG) BY MOUTH EVERY EVENING  Dispense: 90 tablet; Refill: 1  Palpable mass of lower back  Prediabetes  Anxiety and  depression  Mild intermittent asthma without complication   Urinary Tract Infection (UTI) Currently on antibiotics for UTI with side pain. - Continue current antibiotic treatment.  Sleep Apnea Uses CPAP but still fatigued. Enlarged tonsils may obstruct airway. - Continue CPAP use. - Consider ENT evaluation for tonsillectomy if symptoms persist.  Chronic Allergic Rhinitis with Sinusitis Chronic mucus and congestion likely due to allergic rhinitis. Inconsistent Xyzal use. Explained benefits of nasal saline and Flonase. - Initiate nasal saline rinses. - Start Flonase post-nasal saline. - Consider allergist referral.  Asthma Occasional wheezing and shortness of breath. Explained albuterol is for rescue use. - Prescribe albuterol inhaler for rescue use. - Consider pulmonologist referral for pulmonary function tests.  Depression Managing with therapy and Wellbutrin. - Continue current therapy and medication. Managed by [facility] specialist  Anxiety Managed with Wellbutrin. - Continue Wellbutrin as prescribed. Managed by [facility] specialist  Attention Deficit Disorder (ADD) Treated with Vyvanse. - Continue Vyvanse as prescribed. Managed by [facility] specialist  Prediabetes Previously diagnosed. Recommended lifestyle modifications before considering medication. - Order lab work to reassess glucose levels. - Recommend low-carb diet and exercise. - Follow up in three months.  Vitamin D Deficiency Previously diagnosed. Uncertain if current supplementation is adequate. - Order lab work to reassess vitamin D levels. - Consider vitamin D supplementation based on results.  Muscle Knot or Swelling Lump on side suspected to be muscle knot or swelling. No significant pain or impairment. - Advise heat application , massage and stretching. - Use Biofreeze or Voltaren. - Monitor and reassess in three months.  General Health Maintenance Due for Pap smear and eye exam. Dental appointment scheduled.  Discussed Pap smear guidelines. - Contact OBGYN to schedule Pap smear. - Schedule eye exam. - Continue with scheduled dental appointment.  Follow-up Requires follow-up for conditions and health maintenance. - Schedule follow-up in three months for chronic conditions. - Schedule annual physical in one year.   Return in about 1 year (around 05/25/2024) for CPE in 1 year, in 3 mo chronic.    The patient was advised to call back or seek an in-person evaluation if the symptoms worsen or if the condition fails to improve as anticipated.  I discussed the assessment and treatment plan with the patient. The patient was provided an opportunity to ask questions and all were answered. The patient agreed with the plan and demonstrated an understanding of the instructions.  I, Debera Lat, PA-C have reviewed all documentation for this visit. The documentation on 05/26/2023  for the exam, diagnosis, procedures, and orders are all accurate and complete.  Debera Lat, [facility], MMS [facility] [phone] (phone) [phone] (fax)  [facility]

## 2023-05-27 ENCOUNTER — Encounter: Payer: Self-pay | Admitting: Physician Assistant

## 2023-05-27 DIAGNOSIS — J301 Allergic rhinitis due to pollen: Secondary | ICD-10-CM

## 2023-05-27 LAB — LIPID PANEL
Chol/HDL Ratio: 2.7 ratio (ref 0.0–4.4)
Cholesterol, Total: 138 mg/dL (ref 100–199)
HDL: 51 mg/dL (ref >39)
LDL Chol Calc (NIH): 73 mg/dL (ref 0–99)
Triglycerides: 66 mg/dL (ref 0–149)
VLDL Cholesterol Cal: 14 mg/dL (ref 5–40)

## 2023-05-27 LAB — CBC WITH DIFFERENTIAL/PLATELET
Basophils Absolute: 0 10*3/uL (ref 0.0–0.2)
Basos: 1 %
EOS (ABSOLUTE): 0.1 10*3/uL (ref 0.0–0.4)
Eos: 2 %
Hematocrit: 40.4 % (ref 34.0–46.6)
Hemoglobin: 13.5 g/dL (ref 11.1–15.9)
Immature Grans (Abs): 0 10*3/uL (ref 0.0–0.1)
Immature Granulocytes: 0 %
Lymphocytes Absolute: 1.5 10*3/uL (ref 0.7–3.1)
Lymphs: 20 %
MCH: 31 pg (ref 26.6–33.0)
MCHC: 33.4 g/dL (ref 31.5–35.7)
MCV: 93 fL (ref 79–97)
Monocytes Absolute: 0.5 10*3/uL (ref 0.1–0.9)
Monocytes: 7 %
Neutrophils Absolute: 5.4 10*3/uL (ref 1.4–7.0)
Neutrophils: 70 %
Platelets: 297 10*3/uL (ref 150–450)
RBC: 4.35 x10E6/uL (ref 3.77–5.28)
RDW: 13.2 % (ref 11.7–15.4)
WBC: 7.7 10*3/uL (ref 3.4–10.8)

## 2023-05-27 LAB — COMPREHENSIVE METABOLIC PANEL WITH GFR
ALT: 16 IU/L (ref 0–32)
AST: 17 IU/L (ref 0–40)
Albumin: 3.9 g/dL (ref 3.9–4.9)
Alkaline Phosphatase: 39 IU/L — ABNORMAL LOW (ref 44–121)
BUN/Creatinine Ratio: 7 — ABNORMAL LOW (ref 9–23)
BUN: 8 mg/dL (ref 6–20)
Bilirubin Total: 1.1 mg/dL (ref 0.0–1.2)
CO2: 22 mmol/L (ref 20–29)
Calcium: 9 mg/dL (ref 8.7–10.2)
Chloride: 107 mmol/L — ABNORMAL HIGH (ref 96–106)
Creatinine, Ser: 1.07 mg/dL — ABNORMAL HIGH (ref 0.57–1.00)
Globulin, Total: 2.2 g/dL (ref 1.5–4.5)
Glucose: 98 mg/dL (ref 70–99)
Potassium: 4.2 mmol/L (ref 3.5–5.2)
Sodium: 140 mmol/L (ref 134–144)
Total Protein: 6.1 g/dL (ref 6.0–8.5)
eGFR: 70 mL/min/{1.73_m2} (ref >59)

## 2023-05-27 LAB — TSH: TSH: 0.677 u[IU]/mL (ref 0.450–4.500)

## 2023-05-27 LAB — HEMOGLOBIN A1C
Est. average glucose Bld gHb Est-mCnc: 105 mg/dL
Hgb A1c MFr Bld: 5.3 % (ref 4.8–5.6)

## 2023-05-27 LAB — VITAMIN D 25 HYDROXY (VIT D DEFICIENCY, FRACTURES): Vit D, 25-Hydroxy: 15.3 ng/mL — ABNORMAL LOW (ref 30.0–100.0)

## 2023-05-27 LAB — VITAMIN B12: Vitamin B-12: 243 pg/mL (ref 232–1245)

## 2023-05-30 ENCOUNTER — Encounter: Payer: Self-pay | Admitting: Physician Assistant

## 2023-05-30 ENCOUNTER — Other Ambulatory Visit: Payer: Self-pay

## 2023-05-30 MED ORDER — VITAMIN D (ERGOCALCIFEROL) 1.25 MG (50000 UNIT) PO CAPS: 50000.0000 [IU] | ORAL_CAPSULE | ORAL | 0 refills | Status: AC

## 2023-05-30 MED ORDER — FLUTICASONE PROPIONATE 50 MCG/ACT NA SUSP: 2.0000 | Freq: Every day | NASAL | 6 refills | Status: AC

## 2023-05-30 NOTE — Addendum Note (Signed)
 Addended by: Debera Lat on: 05/30/2023 05:02 AM   Modules accepted: Orders

## 2023-06-02 ENCOUNTER — Encounter: Admitting: Physician Assistant

## 2023-07-27 ENCOUNTER — Ambulatory Visit: Payer: Self-pay

## 2023-07-27 ENCOUNTER — Telehealth

## 2023-07-27 NOTE — Telephone Encounter (Signed)
 FYI Only or Action Required?: FYI only for provider  Patient was last seen in primary care on 12/13/2022 by Mimi Alt, MD. Called Nurse Triage reporting urinary symptoms. Symptoms began several days ago. Interventions attempted: Nothing. Symptoms are: unchanged.  Triage Disposition: See Physician Within 24 Hours- Scheduled   Patient/caregiver understands and will follow disposition?: Yes        Reason for Disposition  Urinating more frequently than usual (i.e., frequency)  Answer Assessment - Initial Assessment Questions 1. SYMPTOM: "What's the main symptom you're concerned about?" (e.g., frequency, incontinence)     ----------------------   2. ONSET: "When did the  start?"     ----------------- 5 days ago    3. PAIN: "Is there any pain?" If Yes, ask: "How bad is it?" (Scale: 1-10; mild, moderate, severe)     ---------Denies   4. CAUSE: "What do you think is causing the symptoms?"    --------Feels like its due to not utilizing the bathroom after intercourse.    5. OTHER SYMPTOMS: "Do you have any other symptoms?" (e.g., blood in urine, fever, flank pain, pain with urination)   --- Frequency ------- Urgency    6. PREGNANCY: "Is there any chance you are pregnant?" "When was your last menstrual period?"     Denies- Has Mirena  ( 2010)    Additional Information: Had a UTI- 4mos ago Has a hx of frequent UTI's since childhood. Declines an in person appointment since freq hx of UTI  Protocols used: Urinary Symptoms-A-AH

## 2023-07-28 ENCOUNTER — Ambulatory Visit: Admitting: Internal Medicine

## 2023-07-28 ENCOUNTER — Ambulatory Visit: Payer: Self-pay

## 2023-07-28 ENCOUNTER — Encounter: Payer: Self-pay | Admitting: Internal Medicine

## 2023-07-28 ENCOUNTER — Other Ambulatory Visit: Payer: Self-pay

## 2023-07-28 VITALS — BP 130/72 | HR 94 | Temp 97.8°F | Resp 18 | Ht 65.0 in | Wt 267.1 lb

## 2023-07-28 DIAGNOSIS — N3090 Cystitis, unspecified without hematuria: Secondary | ICD-10-CM

## 2023-07-28 DIAGNOSIS — R350 Frequency of micturition: Secondary | ICD-10-CM

## 2023-07-28 LAB — POCT URINALYSIS DIPSTICK
Bilirubin, UA: NEGATIVE
Glucose, UA: NEGATIVE
Ketones, UA: NEGATIVE
Protein, UA: POSITIVE — AB
Spec Grav, UA: 1.02 (ref 1.010–1.025)
Urobilinogen, UA: 0.2 U/dL
pH, UA: 5 (ref 5.0–8.0)

## 2023-07-28 MED ORDER — SULFAMETHOXAZOLE-TRIMETHOPRIM 800-160 MG PO TABS: 1.0000 | ORAL_TABLET | Freq: Two times a day (BID) | ORAL | 0 refills | Status: AC

## 2023-07-28 NOTE — Telephone Encounter (Signed)
 Due to no openings in our office, patient scheduled with Dr. Bud Care at 3:40 today.

## 2023-07-28 NOTE — Telephone Encounter (Signed)
 FYI Only or Action Required?:  Action required by provider  Patient was last seen in primary care on 12/13/2022 by Mimi Alt, MD. Called Nurse Triage reporting uti symptoms. Symptoms began several days ago. Interventions attempted: Nothing. Symptoms are: gradually worsening.  Triage Disposition: See Physician Within 24 Hours- ADVISED UC, No appointment available  Patient/caregiver understands and will follow disposition?: Yes    Reason for Disposition  Urinating more frequently than usual (i.e., frequency)  Answer Assessment - Initial Assessment Questions 1. SYMPTOM: "What's the main symptom you're concerned about?" (e.g., frequency, incontinence)     Frequent urination, cannot hold it/urgency 2. ONSET: "When did the  symptoms  start?"     Four days ago 3. PAIN: "Is there any pain?" If Yes, ask: "How bad is it?" (Scale: 1-10; mild, moderate, severe)     no 4. CAUSE: "What do you think is causing the symptoms?"     UTI 5. OTHER SYMPTOMS: "Do you have any other symptoms?" (e.g., blood in urine, fever, flank pain, pain with urination)     no 6. PREGNANCY: "Is there any chance you are pregnant?" "When was your last menstrual period?"     No  Patient with frequent UTI requesting call in of Antibiotics. Missed scheduled appointment yesterday. No availability today.  Advised to go to UC  Protocols used: Urinary Symptoms-A-AH

## 2023-07-28 NOTE — Progress Notes (Signed)
 Acute Office Visit  Subjective:     Patient ID: Amanda [name], female    DOB: 1990-06-16, 33 y.o.   MRN: [id number]  Chief Complaint  Patient presents with   Urinary Frequency    For 5 days    Urinary Frequency  Associated symptoms include frequency and urgency. Pertinent negatives include no chills, flank pain or hematuria.   Patient is in today for increased urinary frequency and urgency.   Discussed the use of AI scribe software for clinical note transcription with the patient, who gave verbal consent to proceed.  History of Present Illness Amanda [name] is a 33 year old female with a history of frequent urinary tract infections who presents with symptoms of a urinary tract infection.  For the past five days, she experiences frequent urination, urgency, abdominal cramping, incomplete bladder emptying, and discomfort when the bladder is empty. She denies fever, back pain, flank pain, and visible hematuria.  She has frequent urinary tract infections, occurring two to three times annually since childhood. Previously, she experienced burning sensations, but now her symptoms primarily include increased frequency and urgency. A past episode involved abdominal and side pain, confirmed by culture despite a negative rapid test. She is allergic to doxycycline  and has previously taken Bactrim  for UTIs.  She is currently taking a prebiotic to prevent yeast infections and bacterial vaginosis, which sometimes occur after antibiotic use.   Review of Systems  Constitutional:  Negative for chills and fever.  Gastrointestinal:  Negative for abdominal pain.  Genitourinary:  Positive for frequency and urgency. Negative for dysuria, flank pain and hematuria.        Objective:    BP 130/72 (Cuff Size: Large)   Pulse 94   Temp 97.8 F (36.6 C) (Oral)   Resp 18   Ht 5\' 5"  (1.651 m)   Wt 267 lb 1.6 oz (121.2 kg)   SpO2 98%   BMI 44.45 kg/m  BP Readings from Last 3 Encounters:  07/28/23  130/72  05/26/23 103/63  03/03/23 100/70   Wt Readings from Last 3 Encounters:  07/28/23 267 lb 1.6 oz (121.2 kg)  05/26/23 262 lb 1.6 oz (118.9 kg)  03/03/23 263 lb 6.4 oz (119.5 kg)      Physical Exam Constitutional:      Appearance: Normal appearance.  HENT:     Head: Normocephalic and atraumatic.  Eyes:     Conjunctiva/sclera: Conjunctivae normal.  Cardiovascular:     Rate and Rhythm: Normal rate and regular rhythm.  Pulmonary:     Effort: Pulmonary effort is normal.     Breath sounds: Normal breath sounds.  Abdominal:     Tenderness: There is no right CVA tenderness or left CVA tenderness.  Skin:    General: Skin is warm and dry.  Neurological:     General: No focal deficit present.     Mental Status: She is alert. Mental status is at baseline.  Psychiatric:        Mood and Affect: Mood normal.        Behavior: Behavior normal.     No results found for any visits on 07/28/23.      Assessment & Plan:   Assessment & Plan Urinary Tract Infection (UTI) Symptoms and urinalysis indicate bacterial UTI, likely E. coli. Allergic to doxycycline , Bactrim  preferred for coverage and shorter course. - Prescribe Bactrim  twice daily for three days. - Send urine culture to confirm organism and check resistance. - Advise starting antibiotic today, second  dose at bedtime. - Discuss prophylactic antibiotics with primary care if UTIs remain frequent. - Advise urination post-intercourse and wearing cotton undergarments to reduce UTI risk. - Send prescription to preferred pharmacy.  Frequent Urinary Tract Infections UTIs occur two to three times annually since childhood, possibly due to anatomical factors. Discussed prophylactic antibiotics and preventive measures. - Discuss prophylactic antibiotic use with primary care if UTIs remain frequent. - Advise urination post-intercourse and avoiding baths to reduce UTI risk.  - POCT urinalysis dipstick - Urine Culture -  sulfamethoxazole -trimethoprim  (BACTRIM  DS) 800-160 MG tablet; Take 1 tablet by mouth 2 (two) times daily for 3 days.  Dispense: 6 tablet; Refill: 0     Return if symptoms worsen or fail to improve.  Rockney Cid, DO

## 2023-07-30 LAB — URINE CULTURE
MICRO NUMBER:: 16544603
SPECIMEN QUALITY:: ADEQUATE

## 2023-08-01 ENCOUNTER — Ambulatory Visit: Payer: Self-pay | Admitting: Internal Medicine

## 2023-08-08 ENCOUNTER — Other Ambulatory Visit: Payer: Self-pay | Admitting: Family Medicine

## 2023-08-10 NOTE — Telephone Encounter (Signed)
 Requested Prescriptions  Pending Prescriptions Disp Refills   famotidine  (PEPCID ) 40 MG tablet [Pharmacy Med Name: FAMOTIDINE  40MG  TABLETS] 90 tablet 1    Sig: TAKE 1 TABLET(40 MG) BY MOUTH DAILY     Gastroenterology:  H2 Antagonists Failed - 08/10/2023  2:46 PM      Failed - Valid encounter within last 12 months    Recent Outpatient Visits           1 week ago Cystitis   [facility] Rockney Cid, DO   2 months ago Annual physical exam   [facility] [place], Monalisa Angles, PA-C       Future Appointments             In 9 months Ostwalt, Janna, PA-C [facility]

## 2023-08-26 ENCOUNTER — Other Ambulatory Visit: Payer: Self-pay | Admitting: Family Medicine

## 2023-08-26 ENCOUNTER — Other Ambulatory Visit: Payer: Self-pay | Admitting: Dermatology

## 2023-08-26 DIAGNOSIS — L720 Epidermal cyst: Secondary | ICD-10-CM

## 2023-08-26 DIAGNOSIS — F900 Attention-deficit hyperactivity disorder, predominantly inattentive type: Secondary | ICD-10-CM

## 2023-08-29 ENCOUNTER — Telehealth: Payer: Self-pay | Admitting: Physician Assistant

## 2023-08-29 DIAGNOSIS — F900 Attention-deficit hyperactivity disorder, predominantly inattentive type: Secondary | ICD-10-CM

## 2023-08-29 NOTE — Telephone Encounter (Signed)
 [facility] pharmacy is requesting refill buPROPion  (WELLBUTRIN  XL) 300 MG 24 hr tablet  Please advise

## 2023-08-30 ENCOUNTER — Ambulatory Visit: Admitting: Physician Assistant

## 2023-08-30 NOTE — Telephone Encounter (Signed)
 Requested Prescriptions  Pending Prescriptions Disp Refills   buPROPion  (WELLBUTRIN  XL) 300 MG 24 hr tablet [Pharmacy Med Name: BUPROPION  XL 300MG  TABLETS] 90 tablet 0    Sig: TAKE 1 TABLET(300 MG) BY MOUTH DAILY     Psychiatry: Antidepressants - bupropion  Failed - 08/30/2023 11:22 AM      Failed - Cr in normal range and within 360 days    Creatinine, Ser  Date Value Ref Range Status  05/26/2023 1.07 (H) 0.57 - 1.00 mg/dL Final         Passed - AST in normal range and within 360 days    AST  Date Value Ref Range Status  05/26/2023 17 0 - 40 IU/L Final         Passed - ALT in normal range and within 360 days    ALT  Date Value Ref Range Status  05/26/2023 16 0 - 32 IU/L Final         Passed - Completed PHQ-2 or PHQ-9 in the last 360 days      Passed - Last BP in normal range    BP Readings from Last 1 Encounters:  07/28/23 130/72         Passed - Valid encounter within last 6 months    Recent Outpatient Visits           1 month ago Cystitis   [facility] Bernardo Fend, DO   3 months ago Annual physical exam   [facility] [place], Jolynn, PA-C       Future Appointments             In 9 months Ostwalt, Janna, PA-C [facility]

## 2023-08-31 MED ORDER — BUPROPION HCL ER (XL) 300 MG PO TB24: 300.0000 mg | ORAL_TABLET | Freq: Every day | ORAL | 0 refills | Status: AC

## 2023-09-06 ENCOUNTER — Telehealth: Payer: Self-pay | Admitting: Physician Assistant

## 2023-09-06 NOTE — Telephone Encounter (Unsigned)
 Copied from CRM [id number]. Topic: General - Other >> Sep 06, 2023  4:40 PM Sophia H wrote: Reason for CRM: patient is checking in on a sleep apnea clearance form she dropped off yesterday, wanting to know if the provider has had a chance to fill out and if it can be faxed on her behalf or if she needs to pick up (preferred). Please advise # [phone]

## 2023-09-07 NOTE — Telephone Encounter (Signed)
 error

## 2023-09-13 ENCOUNTER — Ambulatory Visit: Admitting: Physician Assistant

## 2023-09-18 NOTE — Progress Notes (Deleted)
 Established patient visit  Patient: Amanda [name]   DOB: 1991-01-29   33 y.o. Female  MRN: [id number] Visit Date: 09/20/2023  Today's healthcare provider: Jolynn Spencer, PA-C   No chief complaint on file.  Subjective       Discussed the use of AI scribe software for clinical note transcription with the patient, who gave verbal consent to proceed.  History of Present Illness        05/30/2023    1:33 PM 05/27/2023    8:56 AM 04/09/2021   10:17 AM  Depression screen PHQ 2/9  Decreased Interest 1 0 0  Down, Depressed, Hopeless 0 0 0  PHQ - 2 Score 1 0 0  Altered sleeping 0 0 0  Tired, decreased energy 0 0 0  Change in appetite 1 0 0  Feeling bad or failure about yourself  0 0 0  Trouble concentrating 0 0 0  Moving slowly or fidgety/restless 0 0 0  Suicidal thoughts 0 0 0  PHQ-9 Score 2 0 0  Difficult doing work/chores Not difficult at all Not difficult at all Not difficult at all      05/30/2023    1:34 PM 05/27/2023    8:56 AM  GAD 7 : Generalized Anxiety Score  Nervous, Anxious, on Edge 0 0  Control/stop worrying 0 0  Worry too much - different things 0 0  Trouble relaxing 0   Restless 0 0  Easily annoyed or irritable 0 0  Afraid - awful might happen 0 0  Total GAD 7 Score 0   Anxiety Difficulty Not difficult at all Not difficult at all    Medications: Outpatient Medications Prior to Visit  Medication Sig  . albuterol  (VENTOLIN  HFA) 108 (90 Base) MCG/ACT inhaler Inhale 1-2 puffs into the lungs every 4 (four) hours as needed for wheezing or shortness of breath.  . buPROPion  (WELLBUTRIN  XL) 300 MG 24 hr tablet Take 1 tablet (300 mg total) by mouth daily.  . famotidine  (PEPCID ) 40 MG tablet TAKE 1 TABLET(40 MG) BY MOUTH DAILY  . fluticasone  (FLONASE ) 50 MCG/ACT nasal spray Place 2 sprays into both nostrils daily.  SABRA levocetirizine (XYZAL ) 5 MG tablet TAKE 1 TABLET(5 MG) BY MOUTH EVERY EVENING  . levonorgestrel  (MIRENA ) 20 MCG/24HR IUD 1 Intra Uterine Device (1 each  total) by Intrauterine route once for 1 dose.  . Vitamin D , Ergocalciferol , (DRISDOL ) 1.25 MG (50000 UNIT) CAPS capsule Take 1 capsule (50,000 Units total) by mouth every 7 (seven) days. follow with maintenance dose of 1500 to 2000 international units/day (Patient not taking: Reported on 07/28/2023)   No facility-administered medications prior to visit.    Review of Systems  All other systems reviewed and are negative.  All negative Except see HPI   {Insert previous labs (optional):23779} {See past labs  Heme  Chem  Endocrine  Serology  Results Review (optional):1}   Objective    There were no vitals taken for this visit. {Insert last BP/Wt (optional):23777}{See vitals history (optional):1}   Physical Exam Vitals reviewed.  Constitutional:      General: She is not in acute distress.    Appearance: Normal appearance. She is well-developed. She is not diaphoretic.  HENT:     Head: Normocephalic and atraumatic.  Eyes:     General: No scleral icterus.    Conjunctiva/sclera: Conjunctivae normal.  Neck:     Thyroid: No thyromegaly.  Cardiovascular:     Rate and Rhythm: Normal rate and regular rhythm.  Pulses: Normal pulses.     Heart sounds: Normal heart sounds. No murmur heard. Pulmonary:     Effort: Pulmonary effort is normal. No respiratory distress.     Breath sounds: Normal breath sounds. No wheezing, rhonchi or rales.  Musculoskeletal:     Cervical back: Neck supple.     Right lower leg: No edema.     Left lower leg: No edema.  Lymphadenopathy:     Cervical: No cervical adenopathy.  Skin:    General: Skin is warm and dry.     Findings: No rash.  Neurological:     Mental Status: She is alert and oriented to person, place, and time. Mental status is at baseline.  Psychiatric:        Mood and Affect: Mood normal.        Behavior: Behavior normal.     No results found for any visits on 09/20/23.      Assessment and Plan Assessment & Plan     No  orders of the defined types were placed in this encounter.   No follow-ups on file.   The patient was advised to call back or seek an in-person evaluation if the symptoms worsen or if the condition fails to improve as anticipated.  I discussed the assessment and treatment plan with the patient. The patient was provided an opportunity to ask questions and all were answered. The patient agreed with the plan and demonstrated an understanding of the instructions.  I, Asiah Browder, PA-C have reviewed all documentation for this visit. The documentation on 09/20/2023  for the exam, diagnosis, procedures, and orders are all accurate and complete.  Jolynn Spencer, [facility], MMS [facility] [phone] (phone) [phone] (fax)  [facility]

## 2023-09-20 ENCOUNTER — Encounter: Payer: Self-pay | Admitting: Physician Assistant

## 2023-09-20 ENCOUNTER — Ambulatory Visit: Admitting: Physician Assistant

## 2023-10-19 ENCOUNTER — Ambulatory Visit: Admitting: Family Medicine

## 2023-10-21 ENCOUNTER — Ambulatory Visit: Admitting: Family Medicine

## 2023-10-30 NOTE — Progress Notes (Signed)
  MyChart Video Visit  Virtual Visit via Video Note   This format is felt to be most appropriate for this patient at this time. Physical exam was limited by quality of the video and audio technology used for the visit.   Provider location: Virtual Visit Location Provider: Office/Clinic {Patient Location:[phone]::Home}  I discussed the limitations of evaluation and management by telemedicine and the availability of in person appointments. The patient expressed understanding and agreed to proceed.  Patient: Amanda [name]   DOB: [birth year]   33 y.o. Female  MRN: [id number] Visit Date: 10/31/2023  Today's healthcare provider: Jolynn Spencer, PA-C   No chief complaint on file.  Subjective    HPI  *** Discussed the use of AI scribe software for clinical note transcription with the patient, who gave verbal consent to proceed.  History of Present Illness      Medications: Outpatient Medications Prior to Visit  Medication Sig   albuterol  (VENTOLIN  HFA) 108 (90 Base) MCG/ACT inhaler Inhale 1-2 puffs into the lungs every 4 (four) hours as needed for wheezing or shortness of breath.   buPROPion  (WELLBUTRIN  XL) 300 MG 24 hr tablet Take 1 tablet (300 mg total) by mouth daily.   famotidine  (PEPCID ) 40 MG tablet TAKE 1 TABLET(40 MG) BY MOUTH DAILY   fluticasone  (FLONASE ) 50 MCG/ACT nasal spray Place 2 sprays into both nostrils daily.   levocetirizine (XYZAL ) 5 MG tablet TAKE 1 TABLET(5 MG) BY MOUTH EVERY EVENING   levonorgestrel  (MIRENA ) 20 MCG/24HR IUD 1 Intra Uterine Device (1 each total) by Intrauterine route once for 1 dose.   Vitamin D , Ergocalciferol , (DRISDOL ) 1.25 MG (50000 UNIT) CAPS capsule Take 1 capsule (50,000 Units total) by mouth every 7 (seven) days. follow with maintenance dose of 1500 to 2000 international units/day (Patient not taking: Reported on 07/28/2023)   No facility-administered medications prior to visit.    Review of Systems All negative  Except see HPI    {Insert previous labs (optional):23779} {See past labs  Heme  Chem  Endocrine  Serology  Results Review (optional):1}   Objective    There were no vitals taken for this visit.  {Insert last BP/Wt (optional):23777}{See vitals history (optional):1}     Physical Exam  Constitutional:      General: She is not in acute distress.    Appearance: Normal appearance.  HENT:     Head: Normocephalic.  Pulmonary:     Effort: Pulmonary effort is normal. No respiratory distress.  Neurological:     Mental Status: She is alert and oriented to person, place, and time. Mental status is at baseline.      Assessment and Plan Assessment & Plan      ***  No follow-ups on file.     I discussed the assessment and treatment plan with the patient. The patient was provided an opportunity to ask questions and all were answered. The patient agreed with the plan and demonstrated an understanding of the instructions.   The patient was advised to call back or seek an in-person evaluation if the symptoms worsen or if the condition fails to improve as anticipated.  I, Jahdiel Krol, PA-C have reviewed all documentation for this visit. The documentation on 10/31/2023  for the exam, diagnosis, procedures, and orders are all accurate and complete.  Jolynn Spencer, [facility], MMS [facility] [phone] (phone) [phone] (fax)  [facility]

## 2023-10-31 ENCOUNTER — Telehealth: Admitting: Physician Assistant

## 2023-10-31 DIAGNOSIS — E559 Vitamin D deficiency, unspecified: Secondary | ICD-10-CM | POA: Diagnosis not present

## 2023-10-31 DIAGNOSIS — R7303 Prediabetes: Secondary | ICD-10-CM

## 2023-10-31 DIAGNOSIS — G4739 Other sleep apnea: Secondary | ICD-10-CM

## 2023-10-31 DIAGNOSIS — F419 Anxiety disorder, unspecified: Secondary | ICD-10-CM

## 2023-10-31 DIAGNOSIS — J301 Allergic rhinitis due to pollen: Secondary | ICD-10-CM

## 2023-10-31 DIAGNOSIS — N390 Urinary tract infection, site not specified: Secondary | ICD-10-CM | POA: Diagnosis not present

## 2023-10-31 DIAGNOSIS — F17200 Nicotine dependence, unspecified, uncomplicated: Secondary | ICD-10-CM

## 2023-10-31 DIAGNOSIS — R5383 Other fatigue: Secondary | ICD-10-CM

## 2023-10-31 DIAGNOSIS — F32A Depression, unspecified: Secondary | ICD-10-CM

## 2023-10-31 DIAGNOSIS — F900 Attention-deficit hyperactivity disorder, predominantly inattentive type: Secondary | ICD-10-CM

## 2023-11-01 DIAGNOSIS — N390 Urinary tract infection, site not specified: Secondary | ICD-10-CM | POA: Insufficient documentation

## 2023-11-02 ENCOUNTER — Encounter: Payer: Self-pay | Admitting: Physician Assistant

## 2023-11-05 LAB — URINE CULTURE

## 2023-11-05 LAB — SPECIMEN STATUS REPORT

## 2023-11-07 ENCOUNTER — Encounter: Payer: Self-pay | Admitting: Physician Assistant

## 2023-11-08 ENCOUNTER — Ambulatory Visit: Payer: Self-pay | Admitting: Physician Assistant

## 2023-11-08 DIAGNOSIS — N390 Urinary tract infection, site not specified: Secondary | ICD-10-CM

## 2023-12-19 ENCOUNTER — Ambulatory Visit: Admitting: Urology

## 2023-12-26 ENCOUNTER — Other Ambulatory Visit: Payer: Self-pay | Admitting: Physician Assistant

## 2023-12-26 DIAGNOSIS — J301 Allergic rhinitis due to pollen: Secondary | ICD-10-CM

## 2024-01-18 ENCOUNTER — Ambulatory Visit: Admitting: Urology

## 2024-02-01 ENCOUNTER — Ambulatory Visit: Admitting: Urology

## 2024-02-12 ENCOUNTER — Other Ambulatory Visit: Payer: Self-pay | Admitting: Physician Assistant

## 2024-04-04 ENCOUNTER — Ambulatory Visit: Admitting: Medical

## 2024-05-28 ENCOUNTER — Encounter: Admitting: Physician Assistant
